# Patient Record
Sex: Male | Born: 1960
Health system: Southern US, Community
[De-identification: ages and names within clinical notes are randomized; demographics above are authoritative.]

## PROBLEM LIST (undated history)

## (undated) DIAGNOSIS — E669 Obesity, unspecified: Secondary | ICD-10-CM

## (undated) DIAGNOSIS — E785 Hyperlipidemia, unspecified: Secondary | ICD-10-CM

## (undated) HISTORY — PX: TONSILLECTOMY: SUR1361

## (undated) HISTORY — DX: Obesity, unspecified: E66.9

## (undated) HISTORY — PX: UMBILICAL HERNIA REPAIR: SHX2598

## (undated) HISTORY — PX: UMBILICAL HERNIA REPAIR: SHX196

## (undated) HISTORY — DX: Hyperlipidemia, unspecified: E78.5

---

## 2015-04-29 ENCOUNTER — Emergency Department (HOSPITAL_COMMUNITY)
Admission: EM | Admit: 2015-04-29 | Discharge: 2015-04-29 | Disposition: A | Discharge status: Home / Self Care | Payer: Self-pay | Attending: Emergency Medicine | Admitting: Emergency Medicine

## 2015-04-29 ENCOUNTER — Encounter (HOSPITAL_COMMUNITY): Payer: Self-pay | Admitting: Emergency Medicine

## 2015-04-29 ENCOUNTER — Emergency Department (HOSPITAL_COMMUNITY): Payer: Self-pay

## 2015-04-29 DIAGNOSIS — Y939 Activity, unspecified: Secondary | ICD-10-CM | POA: Insufficient documentation

## 2015-04-29 DIAGNOSIS — S82431A Displaced oblique fracture of shaft of right fibula, initial encounter for closed fracture: Secondary | ICD-10-CM | POA: Insufficient documentation

## 2015-04-29 DIAGNOSIS — W1840XA Slipping, tripping and stumbling without falling, unspecified, initial encounter: Secondary | ICD-10-CM | POA: Insufficient documentation

## 2015-04-29 DIAGNOSIS — Y999 Unspecified external cause status: Secondary | ICD-10-CM | POA: Insufficient documentation

## 2015-04-29 DIAGNOSIS — Y929 Unspecified place or not applicable: Secondary | ICD-10-CM | POA: Insufficient documentation

## 2015-04-29 DIAGNOSIS — S82409A Unspecified fracture of shaft of unspecified fibula, initial encounter for closed fracture: Secondary | ICD-10-CM

## 2015-04-29 MED ORDER — HYDROCODONE-ACETAMINOPHEN 5-325 MG PO TABS
1.0000 | ORAL_TABLET | Freq: Once | ORAL | Status: AC
  Administered 2015-04-29: 1 via ORAL
  Filled 2015-04-29: qty 1

## 2015-04-29 MED ORDER — HYDROCODONE-ACETAMINOPHEN 5-325 MG PO TABS: 2.0000 | ORAL_TABLET | ORAL | Status: AC | PRN

## 2015-04-29 NOTE — Discharge Instructions (Signed)
Tibial and Fibular Fracture, Adult Tibial and fibular fracture is a break in the bones of your lower leg (tibia and fibula). The tibia is the larger of these two bones. The fibula is the smaller of the two bones. It is on the outer side of your leg.  CAUSES  Low-energy injuries, such as a fall from ground level.  High-energy injuries, such as motor vehicle injuries, gunshot wounds, or high-speed sports collisions. RISK FACTORS  Jumping activities.  Repetitive stress, such as long-distance running.  Participation in sports.  Osteoporosis.  Advanced age. SIGNS AND SYMPTOMS  Pain.  Swelling.  Inability to put weight on your injured leg.  Bone deformities at the site of your injury.  Bruising. DIAGNOSIS  Tibial and fibular fractures are diagnosed with the use of X-ray exams. TREATMENT  If you have a simple fracture of these two bones, they can be treated with simple immobilization. A cast or splint will be used on your leg to keep it from moving while it heals. Then you can begin range-of-motion exercises to regain your knee motion. HOME CARE INSTRUCTIONS   Apply ice to your leg:  Put ice in a plastic bag.  Place a towel between your skin and the bag.  Leave the ice on for 20 minutes, 2-3 times a day.  If you have a plaster or fiberglass cast:  Do not try to scratch the skin under the cast using sharp or pointed objects.  Check the skin around the cast every day. You may put lotion on any red or sore areas.  Keep your cast dry and clean.  If you have a plaster splint:  Wear the splint as directed.  You may loosen the elastic around the splint if your toes become numb, tingle, or turn cold or blue.  Do not put pressure on any part of your cast or splint until it is fully hardened, because it may deform.  Your cast or splint can be protected during bathing with a plastic bag. Do not lower the cast or splint into water.  Use crutches as directed.  Only take  over-the-counter or prescription medicines for pain, discomfort, or fever as directed by your health care provider.  Follow all instructions given to you by your health care provider.  Make and keep all follow-up appointments. SEEK MEDICAL CARE IF:  Your pain is becoming worse rather than better or is not controlled with medicines.  You have increased swelling or redness in the foot.  You begin to lose feeling in your foot or toes. SEEK IMMEDIATE MEDICAL CARE IF:  You develop a cold or blue foot or toes on the injured side.  You develop severe pain in your injured leg, especially if the pain is increased with movement of your toes. MAKE SURE YOU:  Understand these instructions.  Will watch your condition.  Will get help right away if you are not doing well or get worse.   This information is not intended to replace advice given to you by your health care provider. Make sure you discuss any questions you have with your health care provider.   Follow-up with an orthopedic provider as soon as possible for reevaluation. Avoid bearing weight on your right lower extremity. Keep leg elevated as much as possible. Apply ice to affected area. Take pain medication as needed. Return to the ED if you experience severe worsening of her symptoms, calf tenderness or swelling, increased redness or swelling around your ankle, fevers, chills, chest pain or  shortness of breath.

## 2015-04-29 NOTE — ED Notes (Signed)
Pt reports slipping on the stairs and landing wrong on his right ankle.  He has taken motrin and applied ice consistently with no relief.  Positive for swelling.  Pt is able to take some steps.

## 2015-04-29 NOTE — ED Provider Notes (Signed)
[name]: [id number]     Arrival date & time 04/29/15  1738 History  By signing my name below, I, Octavia Heirrianna Nassar, attest that this documentation has been prepared under the direction and in the presence of [employer] InstrumentsSamantha Tripp Reeta Kuk, PA-C. Electronically Signed: Octavia HeirArianna Nassar, ED Scribe. 04/29/2015. 7:41 PM.      Chief Complaint  Patient presents with  . Ankle Injury      The history is provided by the patient. No language interpreter was used.   HPI Comments: Robert Calhoun is a 55 y.o. male who presents to the Emergency Department complaining of a sudden onset, gradual worsening, moderate, right ankle injury onset last night. Pt has associated swelling and a small abrasion to his right foot. Pt reports he was outside during the rain, slipped and twisted his right ankle. He reports icing and elevating his ankle consistently with no relief. Pt is able to ambulate but reports minor pain when applying pressure to the foot. Pt has taken 800 mg of Motrin to alleviate his pain with temporary relief. There was no other injuries.  History reviewed. No pertinent past medical history. Past Surgical History  Procedure Laterality Date  . Umbilical hernia repair     No family history on file. Social History  Substance Use Topics  . Smoking status: Never Smoker   . Smokeless tobacco: None  . Alcohol Use: No    Review of Systems  A complete 10 system review of systems was obtained and all systems are negative except as noted in the HPI and PMH.   Allergies  Review of patient's allergies indicates no known allergies.  Home Medications   Prior to Admission medications   Not on File   Triage vitals: BP 131/76 mmHg  Pulse 93  Temp(Src) 97.9 F (36.6 C) (Oral)  Resp 15  Ht 6' (1.829 m)  SpO2 95% Physical Exam  Constitutional: He is oriented to person, place, and time. He appears well-developed and well-nourished.  HENT:  Head: Normocephalic.  Eyes: EOM are normal.  Neck: Normal range of  motion.  Pulmonary/Chest: Effort normal.  Abdominal: He exhibits no distension.  Musculoskeletal: Normal range of motion.  TTP of R medial malleolus with associated ecchymosis and edema. No decreased ROM of ankle. No obvious bony deformity. Intact distal pulses. Mild TTP of distal tibia with overlying superficial abrasion.  Neurological: He is alert and oriented to person, place, and time.  Psychiatric: He has a normal mood and affect.  Nursing note and vitals reviewed.   ED Course  Procedures  DIAGNOSTIC STUDIES: Oxygen Saturation is 95% on RA, normal by my interpretation.  COORDINATION OF CARE:  7:29 PM Will order right knee x-ray. Discussed treatment plan which includes crutches with pt at bedside and pt agreed to plan. He was advised to follow up with an orthopedic specialist.  Labs Review Labs Reviewed - No data to display  Imaging Review Dg Ankle Complete Right  04/29/2015  CLINICAL DATA:  Right ankle pain and swelling following twisting injury and fall today. EXAM: RIGHT ANKLE - COMPLETE 3+ VIEW COMPARISON:  None. FINDINGS: There is an oblique, mildly comminuted and displaced fracture of the distal fibula extending proximally from the ankle mortise. The ankle mortise is mildly widened and there is mild widening between the medial malleolus and the talus. The distal tibia demonstrates no acute fracture. There are underlying tibiotalar degenerative changes with fragmented spurring. There are small calcaneal spurs. Soft tissue swelling is present in the lower leg and hindfoot. IMPRESSION:  Mildly displaced oblique fracture of the distal fibula with widening of the ankle mortise suggesting superimposed medial ligamentous injury. Electronically Signed   By: Carey Bullocks M.D.   On: 04/29/2015 19:06   I have personally reviewed and evaluated these images and lab results as part of my medical decision-making.   EKG Interpretation None      MDM   Final diagnoses:  Fibula fracture    Xray reveals mildly displaced oblique fx of the distal fibula with widening of the ankle mortise suggesting superimposed medial ligamentous injury. Pt had associated TTP of distal tibia. Tib/fib xray obtained to r/o Masonneuve fx which was negative. Pt placed in U splint and given crutches. Neurovascularly intact. Pain medication and RICE precautions given. Pt will follow up with ortho, referral given. Return precautions outlined in patient discharge instructions.    I personally performed the services described in this documentation, which was scribed in my presence. The recorded information has been reviewed and is accurate.    Lester Kinsman [place], PA-C 04/30/15 1619  Lyndal Pulley, MD 04/30/15 [phone]

## 2015-05-02 ENCOUNTER — Other Ambulatory Visit: Payer: Self-pay | Admitting: Orthopedic Surgery

## 2015-05-03 ENCOUNTER — Ambulatory Visit (HOSPITAL_COMMUNITY)
Admission: RE | Admit: 2015-05-03 | Discharge: 2015-05-04 | Disposition: A | Discharge status: Home / Self Care | Payer: Self-pay | Source: Ambulatory Visit | Attending: Orthopedic Surgery | Admitting: Orthopedic Surgery

## 2015-05-03 ENCOUNTER — Ambulatory Visit (HOSPITAL_COMMUNITY): Payer: Self-pay

## 2015-05-03 ENCOUNTER — Encounter (HOSPITAL_COMMUNITY)
Admission: RE | Disposition: A | Discharge status: Home / Self Care | Payer: Self-pay | Source: Ambulatory Visit | Attending: Orthopedic Surgery

## 2015-05-03 ENCOUNTER — Ambulatory Visit (HOSPITAL_COMMUNITY): Payer: MEDICAID | Admitting: Certified Registered"

## 2015-05-03 ENCOUNTER — Encounter (HOSPITAL_COMMUNITY): Payer: Self-pay | Admitting: *Deleted

## 2015-05-03 DIAGNOSIS — S8261XA Displaced fracture of lateral malleolus of right fibula, initial encounter for closed fracture: Secondary | ICD-10-CM | POA: Insufficient documentation

## 2015-05-03 DIAGNOSIS — S8263XA Displaced fracture of lateral malleolus of unspecified fibula, initial encounter for closed fracture: Secondary | ICD-10-CM | POA: Diagnosis present

## 2015-05-03 DIAGNOSIS — S82899A Other fracture of unspecified lower leg, initial encounter for closed fracture: Secondary | ICD-10-CM

## 2015-05-03 DIAGNOSIS — W19XXXA Unspecified fall, initial encounter: Secondary | ICD-10-CM | POA: Insufficient documentation

## 2015-05-03 DIAGNOSIS — Z6841 Body Mass Index (BMI) 40.0 and over, adult: Secondary | ICD-10-CM | POA: Insufficient documentation

## 2015-05-03 HISTORY — PX: ORIF ANKLE FRACTURE: SHX5408

## 2015-05-03 LAB — APTT: APTT: 31 s (ref 24–37)

## 2015-05-03 LAB — CBC
HEMATOCRIT: 48.2 % (ref 39.0–52.0)
HEMOGLOBIN: 15.8 g/dL (ref 13.0–17.0)
MCH: 29.6 pg (ref 26.0–34.0)
MCHC: 32.8 g/dL (ref 30.0–36.0)
MCV: 90.4 fL (ref 78.0–100.0)
Platelets: 169 10*3/uL (ref 150–400)
RBC: 5.33 MIL/uL (ref 4.22–5.81)
RDW: 13.2 % (ref 11.5–15.5)
WBC: 9.2 10*3/uL (ref 4.0–10.5)

## 2015-05-03 LAB — COMPREHENSIVE METABOLIC PANEL
ALK PHOS: 43 U/L (ref 38–126)
ALT: 20 U/L (ref 17–63)
AST: 23 U/L (ref 15–41)
Albumin: 3.6 g/dL (ref 3.5–5.0)
Anion gap: 9 (ref 5–15)
BILIRUBIN TOTAL: 0.6 mg/dL (ref 0.3–1.2)
BUN: 13 mg/dL (ref 6–20)
CALCIUM: 9.1 mg/dL (ref 8.9–10.3)
CO2: 25 mmol/L (ref 22–32)
Chloride: 104 mmol/L (ref 101–111)
Creatinine, Ser: 0.82 mg/dL (ref 0.61–1.24)
Glucose, Bld: 96 mg/dL (ref 65–99)
Potassium: 4.2 mmol/L (ref 3.5–5.1)
Sodium: 138 mmol/L (ref 135–145)
TOTAL PROTEIN: 7.6 g/dL (ref 6.5–8.1)

## 2015-05-03 LAB — SURGICAL PCR SCREEN
MRSA, PCR: NEGATIVE
Staphylococcus aureus: NEGATIVE

## 2015-05-03 LAB — PROTIME-INR
INR: 1.13 (ref 0.00–1.49)
Prothrombin Time: 14.7 seconds (ref 11.6–15.2)

## 2015-05-03 SURGERY — OPEN REDUCTION INTERNAL FIXATION (ORIF) ANKLE FRACTURE
Anesthesia: General | Site: Ankle | Laterality: Right

## 2015-05-03 MED ORDER — CEFAZOLIN SODIUM-DEXTROSE 2-4 GM/100ML-% IV SOLN
2.0000 g | Freq: Four times a day (QID) | INTRAVENOUS | Status: DC
  Filled 2015-05-03 (×2): qty 100

## 2015-05-03 MED ORDER — OXYCODONE HCL 5 MG PO TABS: 5.0000 mg | ORAL_TABLET | Freq: Once | ORAL | Status: DC | PRN

## 2015-05-03 MED ORDER — OXYCODONE HCL 5 MG/5ML PO SOLN: 5.0000 mg | Freq: Once | ORAL | Status: DC | PRN

## 2015-05-03 MED ORDER — FENTANYL CITRATE (PF) 250 MCG/5ML IJ SOLN
INTRAMUSCULAR | Status: AC
Start: 2015-05-03 — End: 2015-05-03
  Filled 2015-05-03: qty 5

## 2015-05-03 MED ORDER — POLYETHYLENE GLYCOL 3350 17 G PO PACK: 17.0000 g | PACK | Freq: Every day | ORAL | Status: DC | PRN

## 2015-05-03 MED ORDER — LACTATED RINGERS IV SOLN
INTRAVENOUS | Status: DC
  Administered 2015-05-03 (×2): via INTRAVENOUS

## 2015-05-03 MED ORDER — METOCLOPRAMIDE HCL 5 MG/ML IJ SOLN: 5.0000 mg | Freq: Three times a day (TID) | INTRAMUSCULAR | Status: DC | PRN

## 2015-05-03 MED ORDER — ACETAMINOPHEN 160 MG/5ML PO SOLN: 325.0000 mg | ORAL | Status: DC | PRN

## 2015-05-03 MED ORDER — SODIUM CHLORIDE 0.9 % IV SOLN: INTRAVENOUS | Status: DC

## 2015-05-03 MED ORDER — PROPOFOL 10 MG/ML IV BOLUS
INTRAVENOUS | Status: DC | PRN
  Administered 2015-05-03: 200 mg via INTRAVENOUS

## 2015-05-03 MED ORDER — ONDANSETRON HCL 4 MG/2ML IJ SOLN
INTRAMUSCULAR | Status: DC | PRN
  Administered 2015-05-03: 4 mg via INTRAVENOUS

## 2015-05-03 MED ORDER — CEFAZOLIN SODIUM-DEXTROSE 2-4 GM/100ML-% IV SOLN
2.0000 g | Freq: Four times a day (QID) | INTRAVENOUS | Status: DC
Start: 2015-05-04 — End: 2015-05-04
  Administered 2015-05-04 (×2): 2 g via INTRAVENOUS
  Filled 2015-05-03 (×3): qty 100

## 2015-05-03 MED ORDER — ASPIRIN EC 325 MG PO TBEC
325.0000 mg | DELAYED_RELEASE_TABLET | Freq: Every day | ORAL | Status: DC
  Administered 2015-05-03 – 2015-05-04 (×2): 325 mg via ORAL
  Filled 2015-05-03 (×2): qty 1

## 2015-05-03 MED ORDER — PROPOFOL 10 MG/ML IV BOLUS
INTRAVENOUS | Status: AC
  Filled 2015-05-03: qty 20

## 2015-05-03 MED ORDER — MAGNESIUM CITRATE PO SOLN
1.0000 | Freq: Once | ORAL | Status: AC | PRN
  Administered 2015-05-04: 1 via ORAL
  Filled 2015-05-03: qty 296

## 2015-05-03 MED ORDER — FENTANYL CITRATE (PF) 100 MCG/2ML IJ SOLN
INTRAMUSCULAR | Status: AC
  Administered 2015-05-03: 50 ug
  Filled 2015-05-03: qty 2

## 2015-05-03 MED ORDER — METOCLOPRAMIDE HCL 5 MG PO TABS
5.0000 mg | ORAL_TABLET | Freq: Three times a day (TID) | ORAL | Status: DC | PRN
Start: 2015-05-03 — End: 2015-05-04

## 2015-05-03 MED ORDER — MIDAZOLAM HCL 2 MG/2ML IJ SOLN
INTRAMUSCULAR | Status: AC
  Administered 2015-05-03: 2 mg
  Filled 2015-05-03: qty 2

## 2015-05-03 MED ORDER — PHENYLEPHRINE HCL 10 MG/ML IJ SOLN
INTRAMUSCULAR | Status: DC | PRN
  Administered 2015-05-03: 120 ug via INTRAVENOUS
  Administered 2015-05-03 (×2): 80 ug via INTRAVENOUS

## 2015-05-03 MED ORDER — OXYCODONE-ACETAMINOPHEN 5-325 MG PO TABS: 1.0000 | ORAL_TABLET | ORAL | Status: AC | PRN

## 2015-05-03 MED ORDER — METHOCARBAMOL 500 MG PO TABS
500.0000 mg | ORAL_TABLET | Freq: Four times a day (QID) | ORAL | Status: DC | PRN
  Administered 2015-05-04: 500 mg via ORAL
  Filled 2015-05-03: qty 1

## 2015-05-03 MED ORDER — SUCCINYLCHOLINE CHLORIDE 20 MG/ML IJ SOLN
INTRAMUSCULAR | Status: DC | PRN
  Administered 2015-05-03: 80 mg via INTRAVENOUS

## 2015-05-03 MED ORDER — ACETAMINOPHEN 650 MG RE SUPP
650.0000 mg | Freq: Four times a day (QID) | RECTAL | Status: DC | PRN
Start: 2015-05-03 — End: 2015-05-04

## 2015-05-03 MED ORDER — ACETAMINOPHEN 325 MG PO TABS: 650.0000 mg | ORAL_TABLET | Freq: Four times a day (QID) | ORAL | Status: DC | PRN

## 2015-05-03 MED ORDER — DEXAMETHASONE SODIUM PHOSPHATE 10 MG/ML IJ SOLN
INTRAMUSCULAR | Status: DC | PRN
  Administered 2015-05-03: 10 mg via INTRAVENOUS

## 2015-05-03 MED ORDER — FENTANYL CITRATE (PF) 100 MCG/2ML IJ SOLN
INTRAMUSCULAR | Status: DC | PRN
  Administered 2015-05-03 (×2): 50 ug via INTRAVENOUS
  Administered 2015-05-03: 150 ug via INTRAVENOUS

## 2015-05-03 MED ORDER — HYDROMORPHONE HCL 1 MG/ML IJ SOLN: 0.2500 mg | INTRAMUSCULAR | Status: DC | PRN

## 2015-05-03 MED ORDER — METHOCARBAMOL 1000 MG/10ML IJ SOLN
500.0000 mg | Freq: Four times a day (QID) | INTRAVENOUS | Status: DC | PRN
  Filled 2015-05-03: qty 5

## 2015-05-03 MED ORDER — ONDANSETRON HCL 4 MG/2ML IJ SOLN: 4.0000 mg | Freq: Four times a day (QID) | INTRAMUSCULAR | Status: DC | PRN

## 2015-05-03 MED ORDER — BISACODYL 10 MG RE SUPP: 10.0000 mg | Freq: Every day | RECTAL | Status: DC | PRN

## 2015-05-03 MED ORDER — DEXTROSE 5 % IV SOLN
3.0000 g | INTRAVENOUS | Status: AC
  Administered 2015-05-03: 3 g via INTRAVENOUS
  Filled 2015-05-03: qty 3000

## 2015-05-03 MED ORDER — ONDANSETRON HCL 4 MG PO TABS: 4.0000 mg | ORAL_TABLET | Freq: Four times a day (QID) | ORAL | Status: DC | PRN

## 2015-05-03 MED ORDER — BUPIVACAINE-EPINEPHRINE (PF) 0.5% -1:200000 IJ SOLN
INTRAMUSCULAR | Status: DC | PRN
  Administered 2015-05-03: 30 mL via PERINEURAL
  Administered 2015-05-03: 10 mL via PERINEURAL

## 2015-05-03 MED ORDER — ACETAMINOPHEN 325 MG PO TABS: 325.0000 mg | ORAL_TABLET | ORAL | Status: DC | PRN

## 2015-05-03 MED ORDER — MUPIROCIN 2 % EX OINT
TOPICAL_OINTMENT | CUTANEOUS | Status: AC
  Administered 2015-05-03: 17:00:00
  Filled 2015-05-03: qty 22

## 2015-05-03 MED ORDER — CHLORHEXIDINE GLUCONATE 4 % EX LIQD: 60.0000 mL | Freq: Once | CUTANEOUS | Status: DC

## 2015-05-03 MED ORDER — 0.9 % SODIUM CHLORIDE (POUR BTL) OPTIME
TOPICAL | Status: DC | PRN
  Administered 2015-05-03: 1000 mL

## 2015-05-03 MED ORDER — OXYCODONE HCL 5 MG PO TABS
5.0000 mg | ORAL_TABLET | ORAL | Status: DC | PRN
  Administered 2015-05-04: 10 mg via ORAL
  Filled 2015-05-03: qty 2

## 2015-05-03 MED ORDER — FENTANYL CITRATE (PF) 250 MCG/5ML IJ SOLN
INTRAMUSCULAR | Status: AC
  Filled 2015-05-03: qty 5

## 2015-05-03 MED ORDER — LIDOCAINE 2% (20 MG/ML) 5 ML SYRINGE
INTRAMUSCULAR | Status: AC
  Filled 2015-05-03: qty 5

## 2015-05-03 MED ORDER — MIDAZOLAM HCL 2 MG/2ML IJ SOLN
INTRAMUSCULAR | Status: AC
  Filled 2015-05-03: qty 2

## 2015-05-03 MED ORDER — DOCUSATE SODIUM 100 MG PO CAPS
100.0000 mg | ORAL_CAPSULE | Freq: Two times a day (BID) | ORAL | Status: DC
  Administered 2015-05-03 – 2015-05-04 (×2): 100 mg via ORAL
  Filled 2015-05-03 (×2): qty 1

## 2015-05-03 MED ORDER — HYDROMORPHONE HCL 1 MG/ML IJ SOLN: 1.0000 mg | INTRAMUSCULAR | Status: DC | PRN

## 2015-05-03 MED ORDER — LIDOCAINE 2% (20 MG/ML) 5 ML SYRINGE
INTRAMUSCULAR | Status: AC
  Filled 2015-05-03: qty 10

## 2015-05-03 MED ORDER — LIDOCAINE HCL (CARDIAC) 20 MG/ML IV SOLN
INTRAVENOUS | Status: DC | PRN
  Administered 2015-05-03: 60 mg via INTRAVENOUS

## 2015-05-03 SURGICAL SUPPLY — 50 items
BANDAGE ESMARK 6X9 LF (GAUZE/BANDAGES/DRESSINGS) IMPLANT
BIT DRILL 2.5X110 QC LCP DISP (BIT) ×2 IMPLANT
BNDG COHESIVE 4X5 TAN STRL (GAUZE/BANDAGES/DRESSINGS) ×2 IMPLANT
BNDG ESMARK 6X9 LF (GAUZE/BANDAGES/DRESSINGS)
BNDG GAUZE ELAST 4 BULKY (GAUZE/BANDAGES/DRESSINGS) ×2 IMPLANT
COVER SURGICAL LIGHT HANDLE (MISCELLANEOUS) ×4 IMPLANT
CUFF TOURNIQUET SINGLE 34IN LL (TOURNIQUET CUFF) IMPLANT
CUFF TOURNIQUET SINGLE 44IN (TOURNIQUET CUFF) IMPLANT
DRAPE INCISE IOBAN 66X45 STRL (DRAPES) IMPLANT
DRAPE OEC MINIVIEW 54X84 (DRAPES) ×2 IMPLANT
DRAPE PROXIMA HALF (DRAPES) ×2 IMPLANT
DRAPE U-SHAPE 47X51 STRL (DRAPES) ×2 IMPLANT
DRSG ADAPTIC 3X8 NADH LF (GAUZE/BANDAGES/DRESSINGS) ×2 IMPLANT
DRSG PAD ABDOMINAL 8X10 ST (GAUZE/BANDAGES/DRESSINGS) IMPLANT
DURAPREP 26ML APPLICATOR (WOUND CARE) ×2 IMPLANT
ELECT REM PT RETURN 9FT ADLT (ELECTROSURGICAL) ×2
ELECTRODE REM PT RTRN 9FT ADLT (ELECTROSURGICAL) ×1 IMPLANT
GAUZE SPONGE 4X4 12PLY STRL (GAUZE/BANDAGES/DRESSINGS) ×2 IMPLANT
GLOVE BIOGEL PI IND STRL 9 (GLOVE) ×1 IMPLANT
GLOVE BIOGEL PI INDICATOR 9 (GLOVE) ×1
GLOVE SURG ORTHO 9.0 STRL STRW (GLOVE) ×2 IMPLANT
GOWN STRL REUS W/ TWL XL LVL3 (GOWN DISPOSABLE) ×3 IMPLANT
GOWN STRL REUS W/TWL XL LVL3 (GOWN DISPOSABLE) ×3
KIT 1/3 TUB PL 8H 97M (Orthopedic Implant) ×1 IMPLANT
KIT BASIN OR (CUSTOM PROCEDURE TRAY) ×2 IMPLANT
KIT PREVENA INCISION MGT 13 (CANNISTER) ×2 IMPLANT
KIT ROOM TURNOVER OR (KITS) ×2 IMPLANT
MANIFOLD NEPTUNE II (INSTRUMENTS) ×2 IMPLANT
NS IRRIG 1000ML POUR BTL (IV SOLUTION) ×2 IMPLANT
PACK ORTHO EXTREMITY (CUSTOM PROCEDURE TRAY) ×2 IMPLANT
PAD ARMBOARD 7.5X6 YLW CONV (MISCELLANEOUS) ×4 IMPLANT
PROS 1/3 TUB PL 8H 97M (Orthopedic Implant) ×2 IMPLANT
SCREW CORTEX 3.5 12MM (Screw) ×2 IMPLANT
SCREW CORTEX 3.5 14MM (Screw) ×1 IMPLANT
SCREW CORTEX 3.5 20MM (Screw) ×1 IMPLANT
SCREW LOCK CORT ST 3.5X12 (Screw) ×2 IMPLANT
SCREW LOCK CORT ST 3.5X14 (Screw) ×1 IMPLANT
SCREW LOCK CORT ST 3.5X20 (Screw) ×1 IMPLANT
SCREW LOCK T15 FT 14X3.5X2.9X (Screw) ×2 IMPLANT
SCREW LOCKING 3.5X14 (Screw) ×2 IMPLANT
SPONGE LAP 18X18 X RAY DECT (DISPOSABLE) IMPLANT
STAPLER VISISTAT 35W (STAPLE) IMPLANT
SUCTION FRAZIER HANDLE 10FR (MISCELLANEOUS) ×1
SUCTION TUBE FRAZIER 10FR DISP (MISCELLANEOUS) ×1 IMPLANT
SUT ETHILON 2 0 PSLX (SUTURE) ×4 IMPLANT
SUT VIC AB 2-0 CT1 27 (SUTURE) ×2
SUT VIC AB 2-0 CT1 TAPERPNT 27 (SUTURE) ×2 IMPLANT
TOWEL OR 17X24 6PK STRL BLUE (TOWEL DISPOSABLE) ×2 IMPLANT
TOWEL OR 17X26 10 PK STRL BLUE (TOWEL DISPOSABLE) ×2 IMPLANT
TUBE CONNECTING 12X1/4 (SUCTIONS) ×2 IMPLANT

## 2015-05-03 NOTE — Anesthesia Procedure Notes (Addendum)
Anesthesia Regional Block:  Popliteal block  Pre-Anesthetic Checklist: ,, timeout performed, Correct Patient, Correct Site, Correct Laterality, Correct Procedure, Correct Position, site marked, Risks and benefits discussed,  Surgical consent,  Pre-op evaluation,  At surgeon's request and post-op pain management  Laterality: Lower and Right  Prep: chloraprep       Needles:  Injection technique: Single-shot  Needle Type: Echogenic Stimulator Needle          Additional Needles:  Procedures: ultrasound guided (picture in chart) and nerve stimulator Popliteal block  Nerve Stimulator or Paresthesia:  Response: plantar, 0.5 mA,   Additional Responses:   Narrative:  Injection made incrementally with aspirations every 5 mL.  Performed by: Personally  Anesthesiologist: MOSER, CHRISTOPHER  Additional Notes: H+P and labs reviewed, risks and benefits discussed with patient, procedure tolerated well without complications. Saphenous field block done after prep with 10ml 0.5% Marcaine injected with sequential negative aspirations   Procedure Name: Intubation Date/Time: 05/03/2015 8:04 PM Performed by: Rosiland OzMEYERS, Kamarius Buckbee Pre-anesthesia Checklist: Patient identified, Emergency Drugs available, Suction available, Patient being monitored and Timeout performed Patient Re-evaluated:Patient Re-evaluated prior to inductionOxygen Delivery Method: Circle system utilized Preoxygenation: Pre-oxygenation with 100% oxygen Intubation Type: IV induction Ventilation: Oral airway inserted - appropriate to patient size and Mask ventilation without difficulty Laryngoscope Size: Glidescope and 4 Grade View: Grade I Tube type: Oral Tube size: 7.5 mm Number of attempts: 1 Airway Equipment and Method: Stylet and Video-laryngoscopy Placement Confirmation: ETT inserted through vocal cords under direct vision,  positive ETCO2 and breath sounds checked- equal and bilateral Secured at: 22 cm Tube secured with:  Tape Dental Injury: Teeth and Oropharynx as per pre-operative assessment

## 2015-05-03 NOTE — Op Note (Signed)
05/03/2015  8:45 PM  PATIENT:  Robert Calhoun    PRE-OPERATIVE DIAGNOSIS:  Right Ankle Fracture  POST-OPERATIVE DIAGNOSIS:  Same  PROCEDURE:  OPEN REDUCTION INTERNAL FIXATION (ORIF) RIGHT ANKLE lateral malleolus Placement of Prevena wound VAC SURGEON:  Nadara MustardUDA,MARCUS V, MD  PHYSICIAN ASSISTANT:None ANESTHESIA:   General  PREOPERATIVE INDICATIONS:  Robert Calhoun is a  55 y.o. male with a diagnosis of Right Ankle Fracture who failed conservative measures and elected for surgical management.    The risks benefits and alternatives were discussed with the patient preoperatively including but not limited to the risks of infection, bleeding, nerve injury, cardiopulmonary complications, the need for revision surgery, among others, and the patient was willing to proceed.  OPERATIVE IMPLANTS: 8 hold one third tubular locking plate  OPERATIVE FINDINGS: comminuted fibula and 3 separate fragments  OPERATIVE PROCEDURE: patient brought the operating room and underwent general anesthetic after a popliteal block. After adequate levels anesthesia obtained patient's right lower extremity was prepped using DuraPrep draped into a sterile field. A timeout was called. A lateral incision was made over the fibula this was carried sharply down to the fracture site. The fracture site was freshened irrigated thefirst 2 segments were lag screwed to stabilize the first 2 segments and then this was used to stabilize the most distal segment. A one third tubular plate was placed posterior laterally and this was secured proximally with 3 cortical compression screws secured distally with 2 locking screws. Wounds irrigated with normal saline C-arm floss be verified reduction. The skin was closed using 2-0 nylon. A Prevena wound VAC was applied this had a good suction fit patient was extubated taken the PACU in stable condition.  Plan for overnight observation.  Discharge to home in the morning prescription for Percocet on  the chart.. Nonweightbearing right lower extremity.

## 2015-05-03 NOTE — H&P (Signed)
Robert GaviaGregory Calhoun is an 55 y.o. male.   Chief Complaint: Weber B right ankle fracture HPI: Patient is a 55 year old gentleman morbidly obese who fell sustaining a supination external rotation injury to his right ankle. Patient sustained a Weber B fracture.  No past medical history on file.  Past Surgical History  Procedure Laterality Date  . Umbilical hernia repair      No family history on file. Social History:  reports that he has never smoked. He does not have any smokeless tobacco history on file. He reports that he does not drink alcohol or use illicit drugs.  Allergies: No Known Allergies  No prescriptions prior to admission    No results found for this or any previous visit (from the past 48 hour(s)). No results found.  Review of Systems  All other systems reviewed and are negative.   There were no vitals taken for this visit. Physical Exam  On examination patient has significant swelling to the right lower extremity but there are no actual blisters no ulcers no skin breakdown. Patient does not have a palpable dorsalis pedis pulse he does have a strong dopplerable biphasic pulse. Radiographs shows a displaced ankle mortise with a Weber B fibular fracture. Assessment/Plan Assessment: Displaced Weber B fibular fracture right ankle.  Plan: We'll plan for open reduction internal fixation. Risks and benefits were discussed including risk of infection neurovascular injury pain arthritis DVT need for additional surgery. Patient states he understands wishes to proceed at this time.  Nadara MustardUDA,MARCUS V, MD 05/03/2015, 6:39 AM

## 2015-05-03 NOTE — Anesthesia Postprocedure Evaluation (Signed)
Anesthesia Post Note  Patient: Bettina GaviaGregory Lacroix  Procedure(s) Performed: Procedure(s) (LRB): OPEN REDUCTION INTERNAL FIXATION (ORIF) RIGHT ANKLE (Right)  Patient location during evaluation: PACU Anesthesia Type: General and Regional Level of consciousness: awake Pain management: pain level controlled Vital Signs Assessment: post-procedure vital signs reviewed and stable Respiratory status: spontaneous breathing Cardiovascular status: stable Postop Assessment: no signs of nausea or vomiting Anesthetic complications: no    Last Vitals:  Filed Vitals:   05/03/15 2106 05/03/15 2143  BP: 122/71 107/66  Pulse: 81 77  Temp:  36.7 C  Resp: 13 16    Last Pain:  Filed Vitals:   05/03/15 2144  PainSc: 1                  Gaynelle Pastrana

## 2015-05-03 NOTE — Progress Notes (Signed)
Apolinar JunesBrandon, CRNA at bedside.

## 2015-05-03 NOTE — Anesthesia Preprocedure Evaluation (Signed)
Anesthesia Evaluation  Patient identified by MRN, date of birth, ID band Patient awake    Reviewed: Allergy & Precautions, NPO status , Patient's Chart, lab work & pertinent test results  History of Anesthesia Complications Negative for: history of anesthetic complications  Airway Mallampati: IV  TM Distance: >3 FB Neck ROM: Full    Dental  (+) Loose,    Pulmonary neg pulmonary ROS,    breath sounds clear to auscultation       Cardiovascular  Rhythm:Regular     Neuro/Psych negative neurological ROS  negative psych ROS   GI/Hepatic negative GI ROS,   Endo/Other  Morbid obesity  Renal/GU negative Renal ROS     Musculoskeletal Right ankle fracture    Abdominal   Peds  Hematology negative hematology ROS (+)   Anesthesia Other Findings   Reproductive/Obstetrics                             Anesthesia Physical Anesthesia Plan  ASA: III  Anesthesia Plan: General   Post-op Pain Management:  Regional for Post-op pain   Induction: Intravenous  Airway Management Planned: Oral ETT  Additional Equipment: None  Intra-op Plan:   Post-operative Plan: Extubation in OR  Informed Consent: I have reviewed the patients History and Physical, chart, labs and discussed the procedure including the risks, benefits and alternatives for the proposed anesthesia with the patient or authorized representative who has indicated his/her understanding and acceptance.   Dental advisory given  Plan Discussed with: CRNA and Surgeon  Anesthesia Plan Comments:         Anesthesia Quick Evaluation

## 2015-05-03 NOTE — Transfer of Care (Signed)
Immediate Anesthesia Transfer of Care Note  Patient: Robert GaviaGregory Nestor  Procedure(s) Performed: Procedure(s): OPEN REDUCTION INTERNAL FIXATION (ORIF) RIGHT ANKLE (Right)  Patient Location: PACU  Anesthesia Type:General  Level of Consciousness: awake, alert , oriented and patient cooperative  Airway & Oxygen Therapy: Patient Spontanous Breathing and Patient connected to face mask oxygen  Post-op Assessment: Report given to RN, Post -op Vital signs reviewed and stable and Patient moving all extremities X 4  Post vital signs: Reviewed and stable  Last Vitals:  Filed Vitals:   05/03/15 1925 05/03/15 1930  BP: 108/64 128/66  Pulse: 80 80  Temp:    Resp: 16 17    Last Pain:  Filed Vitals:   05/03/15 2053  PainSc: 1       Patients Stated Pain Goal: 2 (05/03/15 1704)  Complications: No apparent anesthesia complications

## 2015-05-04 MED ORDER — MAGNESIUM CITRATE PO SOLN: 1.0000 | Freq: Once | ORAL | Status: DC

## 2015-05-04 NOTE — Progress Notes (Signed)
Orthopedic Tech Progress Note Patient Details:  Robert GaviaGregory Calhoun 12/21/1960 [id number]  Ortho Devices Type of Ortho Device: CAM walker Ortho Device/Splint Interventions: Application   [name] 05/04/2015, 5:23 AM

## 2015-05-04 NOTE — Care Management Note (Addendum)
Case Management Note  Patient Details  Name: Narvel Bonneau MRN: [id number] Date of Birth: 10/13/1960  Subjective/Objective: 55 yo M underwent ORIF R ankle              Action/Plan: PT is recommending a bariatric 3-in-1 BSC   Expected Discharge Date:     05/04/15             Expected Discharge Plan:  Home/Self Care  In-House Referral:     Discharge planning Services  CM Consult  Post Acute Care Choice:  Durable Medical Equipment Choice offered to:     DME Arranged:  3-N-1 DME Agency:     HH Arranged:    HH Agency:  [facility]  Status of Service:  Completed, signed off  Medicare Important Message Given:    Date Medicare IM Given:    Medicare IM give by:    Date Additional Medicare IM Given:    Additional Medicare Important Message give by:     If discussed at Long Length of Stay Meetings, dates discussed:    Additional Comments: met with pt and wife at bedside. He plans to return home with the support of his wife. He needs a bariatric 3-in-1 BSC. Contacted Jeff at [facility] for DME referral.  Oliveras-Aizpurua, Jeannette, RN 05/04/2015, 3:02 PM  

## 2015-05-04 NOTE — Progress Notes (Signed)
prevena vac in place, foot wwp, stable for dc home today, all questions answered

## 2015-05-04 NOTE — Progress Notes (Signed)
Physical Therapy Evaluation  Clinical Statement:  Patient is functioning at supervision to Mod I level with mobility and gait.  Able to maintain NWB on RLE.  Recommend 3-in-1 BSC for home use.  Patient ready for d/c from PT perspective.   05/04/15 1339  PT Visit Information  Last PT Received On 05/04/15  Assistance Needed +1  History of Present Illness Patient is a 55 yo male admitted 05/03/15 with Rt ankle fx from fall 1 week pta.  Patient now s/p ORIF Rt ankle fx and VAC placement.   PMH:  morbid obesity  Precautions  Precautions Fall  Restrictions  Weight Bearing Restrictions Yes  RLE Weight Bearing NWB  Home Living  Family/patient expects to be discharged to: Private residence  Living Arrangements Spouse/significant other  Available Help at Discharge Family;Available 24 hours/day  Type of Home Apartment (Basement apartment)  Home Access Stairs to enter  Entrance Stairs-Number of Steps several single steps  Home Layout One level  Engineer, waterBathroom Toilet Standard  Home Equipment Walker - 2 wheels  Prior Function  Level of Independence Independent  Comments Works in sedentary job  Communication  Communication No difficulties  Pain Assessment  Pain Assessment No/denies pain  Cognition  Arousal/Alertness Awake/alert  Behavior During Therapy WFL for tasks assessed/performed;Impulsive  Overall Cognitive Status Within Functional Limits for tasks assessed (Decreased safety awareness - ? baseline)  Upper Extremity Assessment  Upper Extremity Assessment Overall WFL for tasks assessed  Lower Extremity Assessment  Lower Extremity Assessment RLE deficits/detail  RLE Deficits / Details decreased strength/ROM from pain/surgery  Bed Mobility  Overal bed mobility Modified Independent  General bed mobility comments Increased time  Transfers  Overall transfer level Modified independent  Equipment used Rolling walker (2 wheeled)  General transfer comment Verbal cues for safe hand placement to  stand - patient pulling on RW.  Able to move to standing with increased time, maintaining NWB on RLE.  On returning to sitting, patient began sitting before completely in safe position and "flopped" onto bed.  Instructed patient on safe technique - correct position, hand placement on bed, and controlling descent.  Ambulation/Gait  Ambulation/Gait assistance Supervision  Ambulation Distance (Feet) 40 Feet  Assistive device Rolling walker (2 wheeled)  Gait Pattern/deviations Step-to pattern (Hop-to on LLE)  General Gait Details Verbal cues for NWB on RLE, and to move at safe pace.  Patient initially rushing during gait.  Much safer at slower pace.  PT - End of Session  Equipment Utilized During Treatment Gait belt  Activity Tolerance Patient tolerated treatment well  Patient left in bed;with call bell/phone within reach;with family/visitor present (sitting EOB)  Nurse Communication Mobility status (Needs BSC for d/c)  PT Assessment  PT Therapy Diagnosis  Abnormality of gait  PT Recommendation/Assessment Patent does not need any further PT services (Patient to d/c today)  No Skilled PT Patient is supervision for all activity/mobility;Patient will have necessary level of assist by caregiver at discharge  PT Recommendation  Follow Up Recommendations No PT follow up;Supervision for mobility/OOB  PT equipment 3in1 (PT)  Acute Rehab PT Goals  PT Goal Formulation All assessment and education complete, DC therapy  PT Time Calculation  PT Start Time (ACUTE ONLY) 1311  PT Stop Time (ACUTE ONLY) 1337  PT Time Calculation (min) (ACUTE ONLY) 26 min  PT G-Codes **NOT FOR INPATIENT CLASS**  Functional Assessment Tool Used Clinical judgement  Functional Limitation Mobility: Walking and moving around  Mobility: Walking and Moving Around Current Status ([id number]) CI  Mobility:  Walking and Moving Around Goal Status [phone]) CI  Mobility: Walking and Moving Around Discharge Status [phone]) CI  PT General  Charges  $$ ACUTE PT VISIT 1 Procedure  PT Evaluation  $PT Eval Low Complexity 1 Procedure  PT Treatments  $Gait Training 8-22 mins  Durenda Hurt. Renaldo Fiddler, [facility] Acute Rehab Services Pager [phone]

## 2015-05-04 NOTE — Progress Notes (Signed)
Physical Therapy Note Evaluation completed with full note to follow.  Patient needs a 3-in-1 BSC for home use.  Contacted CM and informed RN. Durenda HurtSusan H. Renaldo Fiddleravis, PT, [facility] Acute Rehab Services Pager [phone]

## 2015-05-04 NOTE — Discharge Summary (Signed)
Physician Discharge Summary      Patient ID: Robert Calhoun MRN: [id number] DOB/AGE: Jun 05, 1960 55 y.o.  Admit date: 05/03/2015 Discharge date: 05/04/2015  Admission Diagnoses:  <principal problem not specified>  Discharge Diagnoses:  Active Problems:   Ankle fracture, lateral malleolus, closed   History reviewed. No pertinent past medical history.  Surgeries: Procedure(s): OPEN REDUCTION INTERNAL FIXATION (ORIF) RIGHT ANKLE on 05/03/2015   Consultants (if any):    Discharged Condition: Improved  Hospital Course: Robert Calhoun is an 55 y.o. male who was admitted 05/03/2015 with a diagnosis of <principal problem not specified> and went to the operating room on 05/03/2015 and underwent the above named procedures.    He was given perioperative antibiotics:  Anti-infectives    Start     Dose/Rate Route Frequency Ordered Stop   05/04/15 0600  ceFAZolin (ANCEF) 3 g in dextrose 5 % 50 mL IVPB     3 g 130 mL/hr over 30 Minutes Intravenous On call to O.R. 05/03/15 1536 05/03/15 2006   05/04/15 0200  ceFAZolin (ANCEF) IVPB 2g/100 mL premix     2 g 200 mL/hr over 30 Minutes Intravenous Every 6 hours 05/03/15 2148 05/04/15 1959   05/03/15 2145  ceFAZolin (ANCEF) IVPB 2g/100 mL premix  Status:  Discontinued     2 g 200 mL/hr over 30 Minutes Intravenous Every 6 hours 05/03/15 2139 05/03/15 2148    .  He was given sequential compression devices, early ambulation, and aspirin for DVT prophylaxis.  He benefited maximally from the hospital stay and there were no complications.    Recent vital signs:  Filed Vitals:   05/04/15 0036 05/04/15 0446  BP: 118/67 139/73  Pulse: 75 86  Temp: 98.3 F (36.8 C) 98.2 F (36.8 C)  Resp: 16 16    Recent laboratory studies:  Lab Results  Component Value Date   HGB 15.8 05/03/2015   Lab Results  Component Value Date   WBC 9.2 05/03/2015   PLT 169 05/03/2015   Lab Results  Component Value Date   INR 1.13 05/03/2015   Lab Results    Component Value Date   NA 138 05/03/2015   K 4.2 05/03/2015   CL 104 05/03/2015   CO2 25 05/03/2015   BUN 13 05/03/2015   CREATININE 0.82 05/03/2015   GLUCOSE 96 05/03/2015    Discharge Medications:     Medication List    TAKE these medications        HYDROcodone-acetaminophen 5-325 MG tablet  Commonly known as:  NORCO/VICODIN  Take 2 tablets by mouth every 4 (four) hours as needed.     oxyCODONE-acetaminophen 5-325 MG tablet  Commonly known as:  ROXICET  Take 1 tablet by mouth every 4 (four) hours as needed for severe pain.        Diagnostic Studies: Dg Chest 2 View  05/03/2015  CLINICAL DATA:  Preoperative examination prior to right ankle ORIF. EXAM: CHEST  2 VIEW COMPARISON:  None. FINDINGS: Examination is degraded due to patient body habitus. Enlarged cardiac silhouette and mediastinal contours. Minimal bilateral infrahilar heterogeneous opacities favored to represent atelectasis. No focal airspace opacities. No pleural effusion or pneumothorax. No evidence of edema. No acute osseous abnormalities. IMPRESSION: Cardiomegaly and infrahilar atelectasis without acute cardiopulmonary disease. Electronically Signed   By: Simonne ComeJohn  Watts M.D.   On: 05/03/2015 16:16   Dg Tibia/fibula Right  04/29/2015  CLINICAL DATA:  Sister fibular fracture with mortise widening. Evaluate for Masonneuve injury. EXAM: RIGHT TIBIA AND FIBULA - 2  VIEW COMPARISON:  None. FINDINGS: Re- demonstrated obliquely oriented minimally displaced fracture involving the distal fibular metadiaphysis with extension to involve the distal tib-fib joint. This finding is without associated fracture of the more proximal tibia or fibula. Expected adjacent soft tissue swelling.  No radiopaque foreign body. Small plantar calcaneal spur. Minimal enthesopathic change of the Achilles tendon insertion site Moderate tricompartmental degenerative change of the knee, likely worse with the lateral compartment with joint space loss,  subchondral sclerosis and osteophytosis. There is minimal spurring the tibial spines. No evidence of chondrocalcinosis. No joint effusion. IMPRESSION: 1. Re- demonstrated distal fibular metadiaphyseal fracture with extension to the distal tib-fib joint. 2. No additional fracture involving the proximal tibia or fibula. Electronically Signed   By: Simonne Come M.D.   On: 04/29/2015 20:00   Dg Ankle Complete Right  04/29/2015  CLINICAL DATA:  Right ankle pain and swelling following twisting injury and fall today. EXAM: RIGHT ANKLE - COMPLETE 3+ VIEW COMPARISON:  None. FINDINGS: There is an oblique, mildly comminuted and displaced fracture of the distal fibula extending proximally from the ankle mortise. The ankle mortise is mildly widened and there is mild widening between the medial malleolus and the talus. The distal tibia demonstrates no acute fracture. There are underlying tibiotalar degenerative changes with fragmented spurring. There are small calcaneal spurs. Soft tissue swelling is present in the lower leg and hindfoot. IMPRESSION: Mildly displaced oblique fracture of the distal fibula with widening of the ankle mortise suggesting superimposed medial ligamentous injury. Electronically Signed   By: Carey Bullocks M.D.   On: 04/29/2015 19:06    Disposition: 01-Home or Self Care      Discharge Instructions    Call MD / Call 911    Complete by:  As directed   If you experience chest pain or shortness of breath, CALL 911 and be transported to the hospital emergency room.  If you develope a fever above 101 F, pus (white drainage) or increased drainage or redness at the wound, or calf pain, call your surgeon's office.     Call MD / Call 911    Complete by:  As directed   If you experience chest pain or shortness of breath, CALL 911 and be transported to the hospital emergency room.  If you develope a fever above 101.5 F, pus (white drainage) or increased drainage or redness at the wound, or calf pain,  call your surgeon's office.     Constipation Prevention    Complete by:  As directed   Drink plenty of fluids.  Prune juice may be helpful.  You may use a stool softener, such as Colace (over the counter) 100 mg twice a day.  Use MiraLax (over the counter) for constipation as needed.     Constipation Prevention    Complete by:  As directed   Drink plenty of fluids.  Prune juice may be helpful.  You may use a stool softener, such as Colace (over the counter) 100 mg twice a day.  Use MiraLax (over the counter) for constipation as needed.     Diet - low sodium heart healthy    Complete by:  As directed      Diet - low sodium heart healthy    Complete by:  As directed      Diet general    Complete by:  As directed      Driving restrictions    Complete by:  As directed   No driving while taking narcotic  pain meds.     Increase activity slowly as tolerated    Complete by:  As directed      Increase activity slowly as tolerated    Complete by:  As directed            Follow-up Information    Follow up with DUDA,MARCUS V, MD In 1 week.   Specialty:  Orthopedic Surgery   Contact information:   [address] [place] [address] [phone]        Signed: Cheral Almas 05/04/2015, 9:00 AM

## 2015-05-04 NOTE — Progress Notes (Signed)
CM received call to arrange for 3n1.  CM called [facility] DME rep, Trey PaulaJeff to please deliver the 3n1 to room so pt can be discharged.  No other CM needs were communicated.

## 2015-05-06 ENCOUNTER — Encounter (HOSPITAL_COMMUNITY): Payer: Self-pay | Admitting: Orthopedic Surgery

## 2015-10-02 DIAGNOSIS — H903 Sensorineural hearing loss, bilateral: Secondary | ICD-10-CM | POA: Diagnosis not present

## 2015-10-02 DIAGNOSIS — H6523 Chronic serous otitis media, bilateral: Secondary | ICD-10-CM | POA: Diagnosis not present

## 2015-10-02 DIAGNOSIS — H6123 Impacted cerumen, bilateral: Secondary | ICD-10-CM | POA: Diagnosis not present

## 2015-10-02 DIAGNOSIS — H90A22 Sensorineural hearing loss, unilateral, left ear, with restricted hearing on the contralateral side: Secondary | ICD-10-CM | POA: Diagnosis not present

## 2015-10-02 DIAGNOSIS — H90A31 Mixed conductive and sensorineural hearing loss, unilateral, right ear with restricted hearing on the contralateral side: Secondary | ICD-10-CM | POA: Diagnosis not present

## 2016-02-15 DIAGNOSIS — K611 Rectal abscess: Secondary | ICD-10-CM | POA: Diagnosis not present

## 2016-02-15 DIAGNOSIS — J209 Acute bronchitis, unspecified: Secondary | ICD-10-CM | POA: Diagnosis not present

## 2016-02-19 DIAGNOSIS — R05 Cough: Secondary | ICD-10-CM | POA: Diagnosis not present

## 2016-02-19 DIAGNOSIS — L0231 Cutaneous abscess of buttock: Secondary | ICD-10-CM | POA: Diagnosis not present

## 2016-02-24 DIAGNOSIS — L0231 Cutaneous abscess of buttock: Secondary | ICD-10-CM | POA: Diagnosis not present

## 2016-03-04 DIAGNOSIS — L0231 Cutaneous abscess of buttock: Secondary | ICD-10-CM | POA: Diagnosis not present

## 2016-03-04 DIAGNOSIS — Z4802 Encounter for removal of sutures: Secondary | ICD-10-CM | POA: Diagnosis not present

## 2017-01-19 IMAGING — CR DG TIBIA/FIBULA 2V*R*
4 series · 4 of 4 positions shown · non-contrast
Comparison: None.

CLINICAL DATA: Sister fibular fracture with mortise widening.
Evaluate for Masonneuve injury.

EXAM:
RIGHT TIBIA AND FIBULA - 2 VIEW

[x tib-fib ap right (1 of 2)]
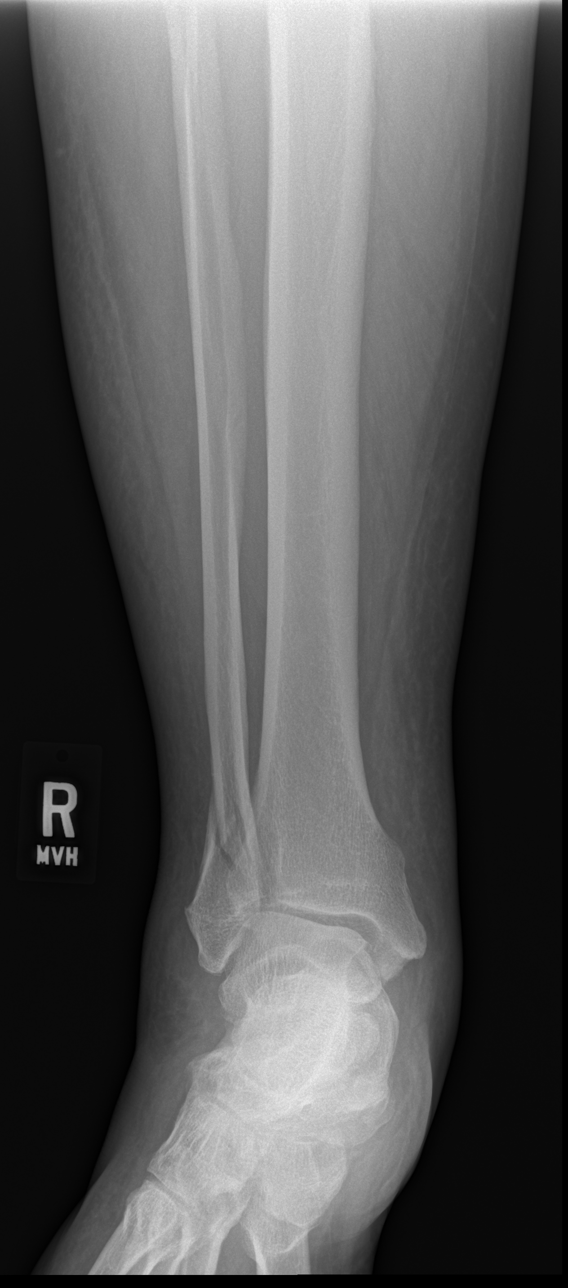

[x tib-fib lat right (1 of 2)]
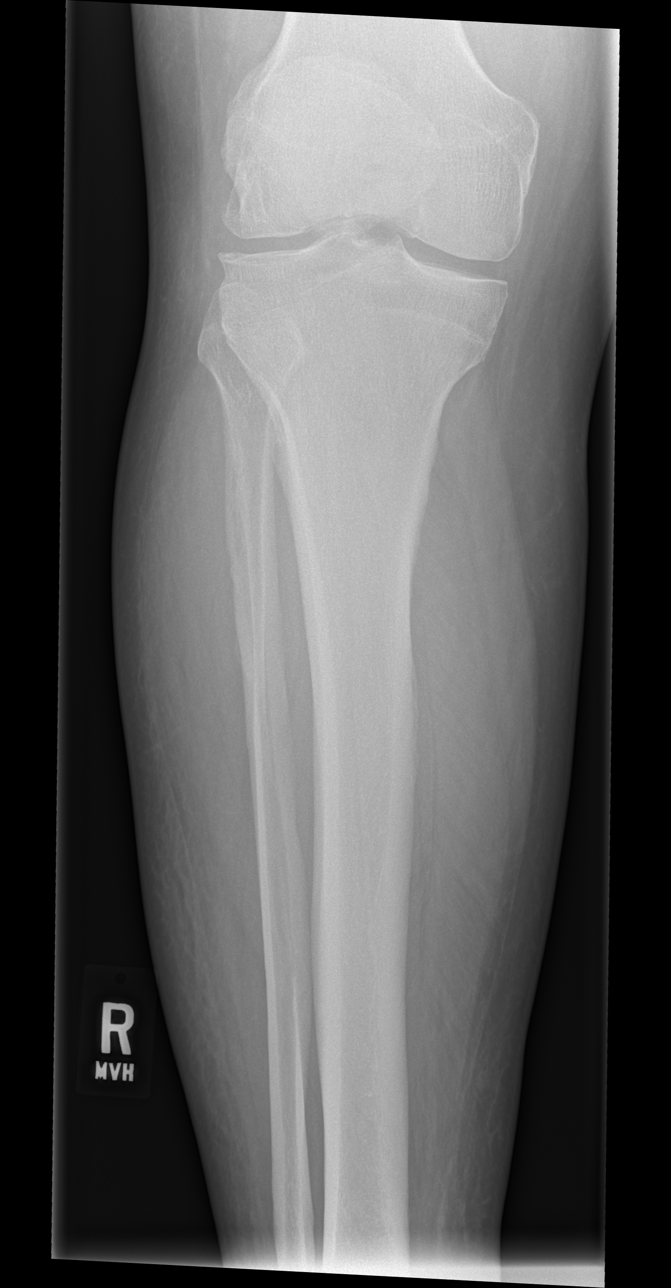

[x tib-fib ap right (2 of 2)]
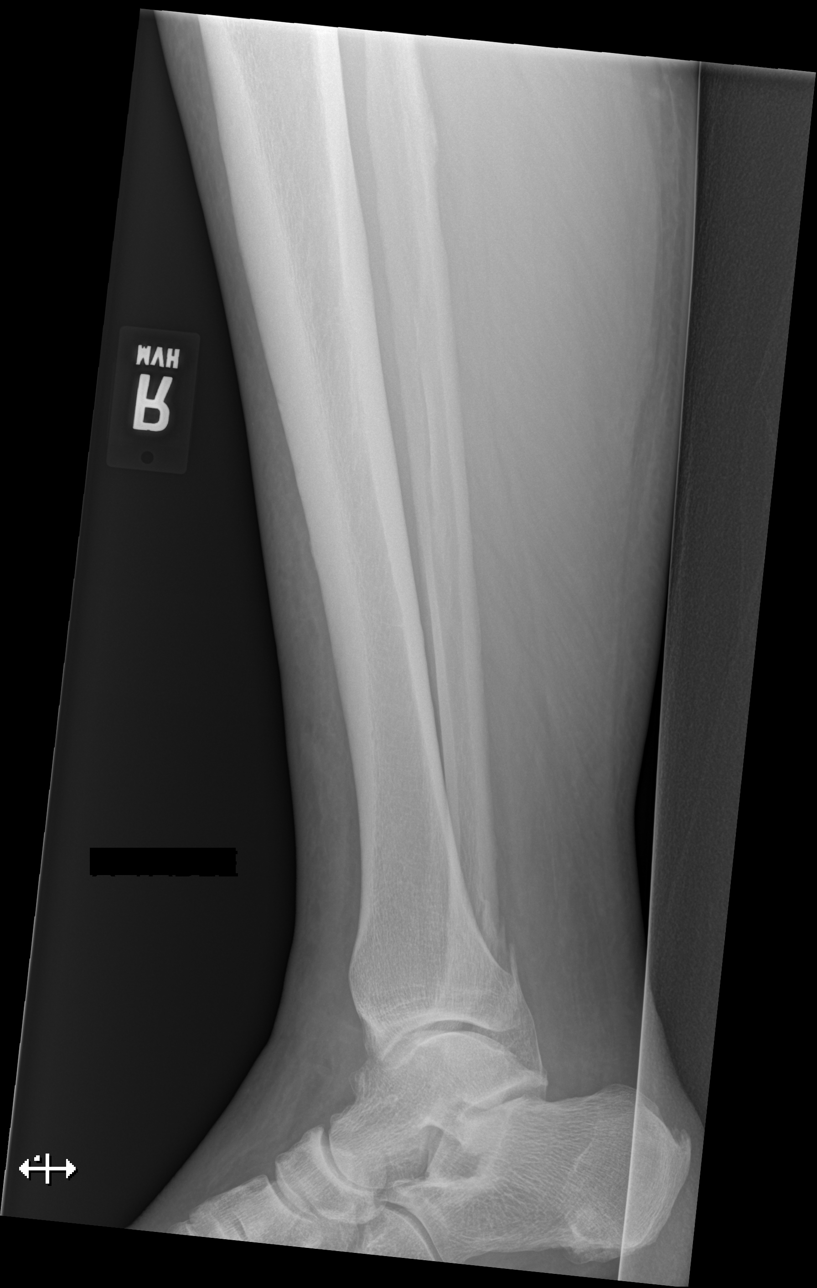

[x tib-fib lat right (2 of 2)]
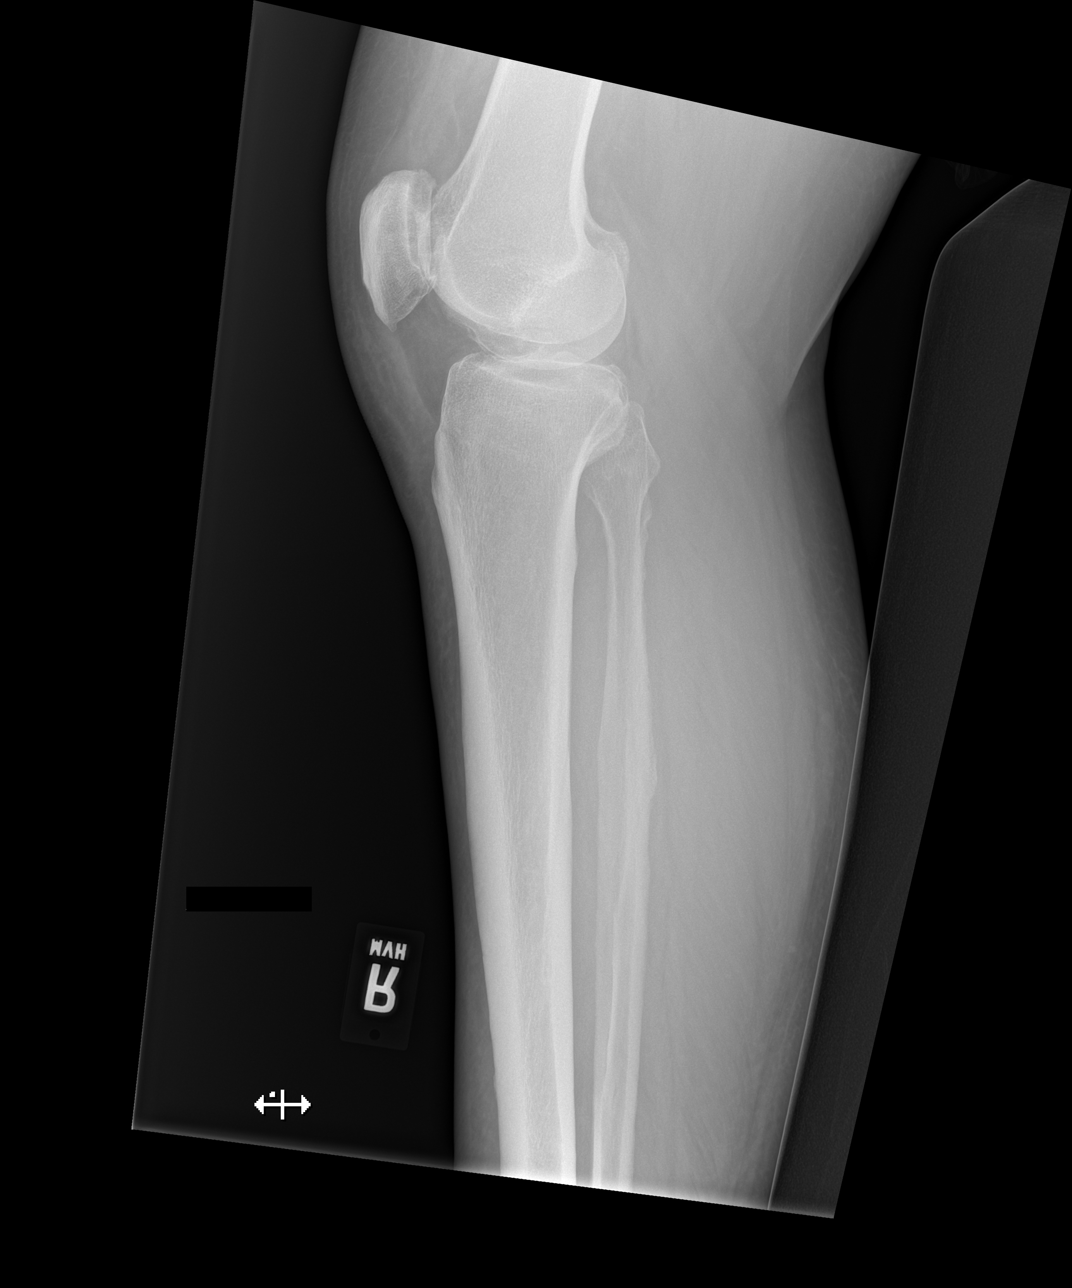

[4 of 4 positions shown; findings below may reference images not displayed]

FINDINGS: Re- demonstrated obliquely oriented minimally displaced fracture
involving the distal fibular metadiaphysis with extension to involve
the distal tib-fib joint.

This finding is without associated fracture of the more proximal
tibia or fibula.

Expected adjacent soft tissue swelling.  No radiopaque foreign body.

Small plantar calcaneal spur. Minimal enthesopathic change of the
Achilles tendon insertion site

Moderate tricompartmental degenerative change of the knee, likely
worse with the lateral compartment with joint space loss,
subchondral sclerosis and osteophytosis. There is minimal spurring
the tibial spines. No evidence of chondrocalcinosis. No joint
effusion.
IMPRESSION: 1. Re- demonstrated distal fibular metadiaphyseal fracture with
extension to the distal tib-fib joint.
2. No additional fracture involving the proximal tibia or fibula.

## 2017-02-02 DIAGNOSIS — M79604 Pain in right leg: Secondary | ICD-10-CM | POA: Diagnosis not present

## 2017-02-02 DIAGNOSIS — M79605 Pain in left leg: Secondary | ICD-10-CM | POA: Diagnosis not present

## 2017-02-02 DIAGNOSIS — Z23 Encounter for immunization: Secondary | ICD-10-CM | POA: Diagnosis not present

## 2017-02-02 DIAGNOSIS — R600 Localized edema: Secondary | ICD-10-CM | POA: Diagnosis not present

## 2017-02-09 DIAGNOSIS — H6123 Impacted cerumen, bilateral: Secondary | ICD-10-CM | POA: Diagnosis not present

## 2017-02-09 DIAGNOSIS — H6523 Chronic serous otitis media, bilateral: Secondary | ICD-10-CM | POA: Diagnosis not present

## 2017-02-09 DIAGNOSIS — H903 Sensorineural hearing loss, bilateral: Secondary | ICD-10-CM | POA: Diagnosis not present

## 2017-04-19 DIAGNOSIS — H6091 Unspecified otitis externa, right ear: Secondary | ICD-10-CM | POA: Diagnosis not present

## 2017-05-05 DIAGNOSIS — H6123 Impacted cerumen, bilateral: Secondary | ICD-10-CM | POA: Diagnosis not present

## 2017-05-05 DIAGNOSIS — H60333 Swimmer's ear, bilateral: Secondary | ICD-10-CM | POA: Diagnosis not present

## 2017-05-19 DIAGNOSIS — M545 Low back pain: Secondary | ICD-10-CM | POA: Diagnosis not present

## 2017-05-28 DIAGNOSIS — M5136 Other intervertebral disc degeneration, lumbar region: Secondary | ICD-10-CM | POA: Diagnosis not present

## 2017-06-02 DIAGNOSIS — M5136 Other intervertebral disc degeneration, lumbar region: Secondary | ICD-10-CM | POA: Diagnosis not present

## 2017-06-04 DIAGNOSIS — M5136 Other intervertebral disc degeneration, lumbar region: Secondary | ICD-10-CM | POA: Diagnosis not present

## 2017-06-07 DIAGNOSIS — M47816 Spondylosis without myelopathy or radiculopathy, lumbar region: Secondary | ICD-10-CM | POA: Diagnosis not present

## 2017-06-10 DIAGNOSIS — M5136 Other intervertebral disc degeneration, lumbar region: Secondary | ICD-10-CM | POA: Diagnosis not present

## 2017-06-11 DIAGNOSIS — M5136 Other intervertebral disc degeneration, lumbar region: Secondary | ICD-10-CM | POA: Diagnosis not present

## 2017-06-15 DIAGNOSIS — M5136 Other intervertebral disc degeneration, lumbar region: Secondary | ICD-10-CM | POA: Diagnosis not present

## 2017-06-17 DIAGNOSIS — M47816 Spondylosis without myelopathy or radiculopathy, lumbar region: Secondary | ICD-10-CM | POA: Diagnosis not present

## 2017-06-21 DIAGNOSIS — M5136 Other intervertebral disc degeneration, lumbar region: Secondary | ICD-10-CM | POA: Diagnosis not present

## 2017-06-24 DIAGNOSIS — M5136 Other intervertebral disc degeneration, lumbar region: Secondary | ICD-10-CM | POA: Diagnosis not present

## 2017-07-01 DIAGNOSIS — M5136 Other intervertebral disc degeneration, lumbar region: Secondary | ICD-10-CM | POA: Diagnosis not present

## 2017-07-02 DIAGNOSIS — M79605 Pain in left leg: Secondary | ICD-10-CM | POA: Diagnosis not present

## 2017-07-02 DIAGNOSIS — L918 Other hypertrophic disorders of the skin: Secondary | ICD-10-CM | POA: Diagnosis not present

## 2017-07-02 DIAGNOSIS — M545 Low back pain: Secondary | ICD-10-CM | POA: Diagnosis not present

## 2017-07-02 DIAGNOSIS — R600 Localized edema: Secondary | ICD-10-CM | POA: Diagnosis not present

## 2017-07-06 ENCOUNTER — Other Ambulatory Visit: Payer: Self-pay | Admitting: Family Medicine

## 2017-07-06 DIAGNOSIS — M79604 Pain in right leg: Secondary | ICD-10-CM

## 2017-07-07 DIAGNOSIS — M5136 Other intervertebral disc degeneration, lumbar region: Secondary | ICD-10-CM | POA: Diagnosis not present

## 2017-07-15 ENCOUNTER — Ambulatory Visit
Admission: RE | Admit: 2017-07-15 | Discharge: 2017-07-15 | Disposition: A | Discharge status: Home / Self Care | Payer: BLUE CROSS/BLUE SHIELD | Source: Ambulatory Visit | Attending: Family Medicine | Admitting: Family Medicine

## 2017-07-15 DIAGNOSIS — M79604 Pain in right leg: Secondary | ICD-10-CM

## 2017-07-15 DIAGNOSIS — M79661 Pain in right lower leg: Secondary | ICD-10-CM | POA: Diagnosis not present

## 2017-07-15 DIAGNOSIS — M79662 Pain in left lower leg: Secondary | ICD-10-CM | POA: Diagnosis not present

## 2017-07-16 ENCOUNTER — Telehealth: Payer: Self-pay | Admitting: *Deleted

## 2017-07-16 ENCOUNTER — Encounter: Payer: Self-pay | Admitting: Podiatry

## 2017-07-16 ENCOUNTER — Ambulatory Visit (INDEPENDENT_AMBULATORY_CARE_PROVIDER_SITE_OTHER): Payer: BLUE CROSS/BLUE SHIELD | Admitting: Podiatry

## 2017-07-16 DIAGNOSIS — B353 Tinea pedis: Secondary | ICD-10-CM | POA: Diagnosis not present

## 2017-07-16 DIAGNOSIS — R609 Edema, unspecified: Secondary | ICD-10-CM | POA: Diagnosis not present

## 2017-07-16 MED ORDER — KETOCONAZOLE 2 % EX CREA: 1.0000 "application " | TOPICAL_CREAM | Freq: Every day | CUTANEOUS | 0 refills | Status: AC

## 2017-07-16 NOTE — Progress Notes (Signed)
   Subjective:    Patient ID: Robert Calhoun, male    DOB: March 23, 1960, 57 y.o.   MRN: [id number]  HPI 57 year old male presents the office today for concerns of possible fungus to his feet.  He states that his feet did age previously but currently denies any drainage.  Denies any open sores.  He has noticed some mild swelling to his feet.  He recently had an arterial study performed yesterday.  This is been ongoing issue for about 1 year for him and denies any acute changes recently.   Review of Systems  All other systems reviewed and are negative.  History reviewed. No pertinent past medical history.  Past Surgical History:  Procedure Laterality Date  . ORIF ANKLE FRACTURE Right 05/03/2015   Procedure: OPEN REDUCTION INTERNAL FIXATION (ORIF) RIGHT ANKLE;  Surgeon: Nadara MustardMarcus Duda V, MD;  Location: [facility] OR;  Service: Orthopedics;  Laterality: Right;  . TONSILLECTOMY    . UMBILICAL HERNIA REPAIR    . UMBILICAL HERNIA REPAIR       Current Outpatient Medications:  .  amoxicillin (AMOXIL) 500 MG capsule, , Disp: , Rfl: 0 .  fluticasone (FLONASE) 50 MCG/ACT nasal spray, USE 2 SPRAY(S) IN EACH NOSTRIL ONCE DAILY AT NIGHT, Disp: , Rfl: 5 .  furosemide (LASIX) 20 MG tablet, TAKE 1 TABLET BY MOUTH ONCE DAILYPLEASE SCHEDULE FOLLOW UP VISIT, Disp: , Rfl: 0 .  HYDROcodone-acetaminophen (NORCO/VICODIN) 5-325 MG tablet, Take 2 tablets by mouth every 4 (four) hours as needed. (Patient taking differently: Take 1-2 tablets by mouth every 4 (four) hours as needed. ), Disp: 10 tablet, Rfl: 0 .  ketoconazole (NIZORAL) 2 % cream, Apply 1 application topically daily., Disp: 60 g, Rfl: 0 .  meloxicam (MOBIC) 15 MG tablet, TAKE 1 TABLET BY MOUTH ONCE DAILY IN THE MORNING WITH FOOD FOR PAIN, Disp: , Rfl: 1 .  neomycin-polymyxin-hydrocortisone (CORTISPORIN) 3.5-10000-1 OTIC suspension, , Disp: , Rfl: 0 .  oxyCODONE-acetaminophen (ROXICET) 5-325 MG tablet, Take 1 tablet by mouth every 4 (four) hours as needed for  severe pain., Disp: 60 tablet, Rfl: 0  No Known Allergies       Objective:   Physical Exam  General: AAO x3, NAD  Dermatological: There is mild evidence of tinea pedis interdigitally as well as the plantar aspect the foot.  He is more concerned about small raised bumps on the top of his feet.  This is likely more from swelling, lymphedema, changes to his feet.  These are flesh/white-colored multiple on his toes.  There is no open sores identified there is no drainage.  Vascular: Dorsalis Pedis artery and Posterior Tibial artery pedal pulses are 2/4 bilateral with immedate capillary fill time.  There is no pain with calf compression, swelling, warmth, erythema.  Mild swelling bilaterally.  Neruologic: Grossly intact via light touch bilateral.Protective threshold with Semmes Wienstein monofilament intact to all pedal sites bilateral.   Musculoskeletal: No pain, crepitus, or limitation noted with foot and ankle range of motion bilateral. Muscular strength 5/5 in all groups tested bilateral.  Gait: Unassisted, Nonantalgic.     Assessment & Plan:  57 year old male with tinea pedis, swelling bilaterally -Treatment options discussed including all alternatives, risks, and complications -Etiology of symptoms were discussed -Ketoconazole prescribed today. -Venous reflux study was ordered today due to the swelling.  I think that a lot of the skin changes are due to venous insufficiency.  Vivi BarrackMatthew R Bettejane Leavens DPM

## 2017-07-16 NOTE — Telephone Encounter (Signed)
Faxed orders to [facility]. 

## 2017-07-16 NOTE — Telephone Encounter (Signed)
-----   Message from Vivi BarrackMatthew R Wagoner, DPM sent at 07/16/2017  9:32 AM EDT ----- Can you please order a venous reflux study due to swelling? Thanks.

## 2017-07-19 ENCOUNTER — Ambulatory Visit (HOSPITAL_COMMUNITY)
Admission: RE | Admit: 2017-07-19 | Discharge: 2017-07-19 | Disposition: A | Discharge status: Home / Self Care | Payer: BLUE CROSS/BLUE SHIELD | Source: Ambulatory Visit | Attending: Family | Admitting: Family

## 2017-07-19 ENCOUNTER — Telehealth: Payer: Self-pay | Admitting: *Deleted

## 2017-07-19 DIAGNOSIS — R609 Edema, unspecified: Secondary | ICD-10-CM | POA: Insufficient documentation

## 2017-07-19 NOTE — Telephone Encounter (Signed)
Robert PikesSusan - [facility] states pt's B/L venous doppler are negative for DVT, pt was informed and will be released.

## 2017-07-22 NOTE — Telephone Encounter (Signed)
I informed pt of Dr. Gabriel RungWagoner's review of results and orders. [facility] and would like to schedule when he returns. I told pt our office made the referral and the test facility would call to schedule pt at pt's convenience and pt stated that would be fine. Orders faxed to [facility].

## 2017-07-22 NOTE — Telephone Encounter (Signed)
-----   Message from Vivi BarrackMatthew R Wagoner, DPM sent at 07/22/2017  6:50 AM EDT ----- Val- Please let him know that there is no blood clot and looks like the veins are functioning correctly based on this study. I would recommend getting a venous reflux study.

## 2017-08-17 ENCOUNTER — Ambulatory Visit (HOSPITAL_COMMUNITY)
Admission: RE | Admit: 2017-08-17 | Discharge: 2017-08-17 | Disposition: A | Discharge status: Home / Self Care | Payer: BLUE CROSS/BLUE SHIELD | Source: Ambulatory Visit | Attending: Vascular Surgery | Admitting: Vascular Surgery

## 2017-08-17 DIAGNOSIS — R609 Edema, unspecified: Secondary | ICD-10-CM | POA: Insufficient documentation

## 2017-08-18 ENCOUNTER — Telehealth: Payer: Self-pay | Admitting: *Deleted

## 2017-08-18 NOTE — Progress Notes (Signed)
Called and spoke with the patient and patient stated that he was still having some pain in the ankle area up to the leg and some in the foot and per Dr Ardelle AntonWagoner will have patient come in and get some new x-rays and see what we can do. Robert Calhoun

## 2017-08-18 NOTE — Telephone Encounter (Signed)
Patient called and per Dr Ardelle AntonWagoner the test for the blood clots were normal and the patient stated that he was still having some pain and Dr Ardelle AntonWagoner stated for the patient to come in and get a new set of x-rays. Misty StanleyLisa

## 2017-08-19 ENCOUNTER — Ambulatory Visit (INDEPENDENT_AMBULATORY_CARE_PROVIDER_SITE_OTHER): Payer: BLUE CROSS/BLUE SHIELD | Admitting: Podiatry

## 2017-08-19 ENCOUNTER — Ambulatory Visit (INDEPENDENT_AMBULATORY_CARE_PROVIDER_SITE_OTHER): Payer: BLUE CROSS/BLUE SHIELD

## 2017-08-19 DIAGNOSIS — M2141 Flat foot [pes planus] (acquired), right foot: Secondary | ICD-10-CM | POA: Diagnosis not present

## 2017-08-19 DIAGNOSIS — M19079 Primary osteoarthritis, unspecified ankle and foot: Secondary | ICD-10-CM | POA: Diagnosis not present

## 2017-08-19 DIAGNOSIS — L853 Xerosis cutis: Secondary | ICD-10-CM

## 2017-08-19 DIAGNOSIS — M779 Enthesopathy, unspecified: Secondary | ICD-10-CM | POA: Diagnosis not present

## 2017-08-19 DIAGNOSIS — M1990 Unspecified osteoarthritis, unspecified site: Secondary | ICD-10-CM

## 2017-08-19 DIAGNOSIS — M2142 Flat foot [pes planus] (acquired), left foot: Secondary | ICD-10-CM

## 2017-08-19 DIAGNOSIS — M79605 Pain in left leg: Secondary | ICD-10-CM

## 2017-08-19 MED ORDER — DICLOFENAC SODIUM 1 % TD GEL: 2.0000 g | Freq: Four times a day (QID) | TRANSDERMAL | 2 refills | Status: AC

## 2017-08-19 MED ORDER — AMMONIUM LACTATE 12 % EX CREA: TOPICAL_CREAM | CUTANEOUS | 0 refills | Status: DC | PRN

## 2017-08-19 NOTE — Progress Notes (Signed)
Subjective: 57 year old male presents the office today for concerns of continued pain to the left leg.  He states that his pain to the also aspect the left leg he points on the mid substance of the calf laterally with majority discomfort mostly with weightbearing.  He denies any recent injury or trauma this been a chronic issue for him.  Previously had arterial studies as well as venous studies.  He does have a history of right ankle fracture.  He has no other concerns.  No claudication symptoms. Denies any systemic complaints such as fevers, chills, nausea, vomiting. No acute changes since last appointment, and no other complaints at this time.   Objective: AAO x3, NAD DP/PT pulses palpable bilaterally, CRT less than 3 seconds There is tenderness palpation along the lateral aspect of the left leg on the mid substance and it appears to be more on the peroneal tendons, musculature there is no specific area pinpoint tenderness.  Upon weightbearing is a decrease in medial arch upon weightbearing.  Ankle, subtalar joint range of motion intact.  No area of tenderness to bilateral foot or ankles. Dry skin present.  No open lesions or pre-ulcerative lesions.  No pain with calf compression, swelling, warmth, erythema  Assessment: 57 year old male with left lateral leg pain  Plan: -All treatment options discussed with the patient including all alternatives, risks, complications.  -X-rays of the foot and ankle were reviewed today.  Hardware intact and a prior ankle fracture on the right side.  Arthritic changes present of the foot.  Also arthritic changes present the right ankle. -I do think that his pain is more muscular in nature however I would order an x-ray of the tib-fib of the left side to rule any osseous pathology.  Also prescribed Voltaren gel for him to use topically.  Has been taking 800 mg ibuprofen not any relief he also takes meloxicam.  Recommend him only to take either or not together.  Also  I think that trying to support his foot will be helpful and discussed arch supports.  He wants to proceed with custom inserts.  I will have him follow-up with Raiford Nobleick for this.  Also pending the x-rays will likely refer to physical therapy at his request as I do exercises at home in his own.  Will refer to [facility]. -He is also asking for prescription for moisturizer.  Prescribed AmLactin. -Patient encouraged to call the office with any questions, concerns, change in symptoms.   Vivi BarrackMatthew R Baylor Cortez DPM  Vivi BarrackMatthew R Danyetta Gillham DPM

## 2017-08-20 ENCOUNTER — Telehealth: Payer: Self-pay | Admitting: Podiatry

## 2017-08-20 NOTE — Telephone Encounter (Signed)
Pt is waiting for prior-authorization for his medication and pharmacy notified our office. The patient would like an update.

## 2017-08-24 ENCOUNTER — Telehealth: Payer: Self-pay | Admitting: Podiatry

## 2017-08-24 NOTE — Telephone Encounter (Signed)
Pt called and left message stating he called insurance and they said he has met his family out of pocket max and the orthotics are covered at 100%.

## 2017-08-25 ENCOUNTER — Telehealth: Payer: Self-pay | Admitting: Podiatry

## 2017-08-25 ENCOUNTER — Telehealth: Payer: Self-pay | Admitting: *Deleted

## 2017-08-25 NOTE — Telephone Encounter (Signed)
Forgot to enter that in the message pt said to call him if I needed to talk to him.

## 2017-08-25 NOTE — Telephone Encounter (Signed)
Discussed with patient the differences in what he was told by [employer] and what we were told on the provider line.  He will call [employer] once again and give them our case # for reference.

## 2017-08-25 NOTE — Telephone Encounter (Signed)
Pt called and left message that this is 2nd call he has made and hope he does not need to make a 3rd call. He states he called his insurance company and was on the phone 1.5 to 2 hrs with [employer] and they told him the orthotics would be covered at 100% since he has met his family out of pocket. Pt did not leave his name just his number.He wants to verify his insurance coverage for his appt Monday with [facility].

## 2017-08-26 NOTE — Telephone Encounter (Signed)
Pt states one of the medication he was able to have filled and is helping, he would like to know if it was prior authorized.

## 2017-08-26 NOTE — Telephone Encounter (Signed)
I told pt is had received his message yesterday late and was returning his call, and I needed to know which medication he was calling about PA. Pt states he has both medications and he was wanting to know if a PA had been performed or if the medication had been changed. I told pt the medications had not been changed and I did not see that a PA had been performed for either.

## 2017-08-27 ENCOUNTER — Ambulatory Visit (INDEPENDENT_AMBULATORY_CARE_PROVIDER_SITE_OTHER): Payer: BLUE CROSS/BLUE SHIELD | Admitting: Podiatry

## 2017-08-27 ENCOUNTER — Telehealth: Payer: Self-pay | Admitting: *Deleted

## 2017-08-27 DIAGNOSIS — M2141 Flat foot [pes planus] (acquired), right foot: Secondary | ICD-10-CM | POA: Diagnosis not present

## 2017-08-27 DIAGNOSIS — M2142 Flat foot [pes planus] (acquired), left foot: Secondary | ICD-10-CM

## 2017-08-27 DIAGNOSIS — M779 Enthesopathy, unspecified: Secondary | ICD-10-CM | POA: Diagnosis not present

## 2017-08-27 DIAGNOSIS — M79605 Pain in left leg: Secondary | ICD-10-CM

## 2017-08-27 NOTE — Telephone Encounter (Signed)
Pt was seen in office today by Dr. Ardelle AntonWagoner and states he has not been contacted for the tib-fib x-ray.

## 2017-08-27 NOTE — Telephone Encounter (Signed)
I informed pt of Robert Calhoun - [facility] Scheduling statement and instructions. Pt states understanding.

## 2017-08-27 NOTE — Telephone Encounter (Signed)
[facility] Scheduling states x-rays only are walk-in appts, pt may use [facility]tr[facility] and go to the 1st Floor Radiology.

## 2017-08-30 ENCOUNTER — Ambulatory Visit (INDEPENDENT_AMBULATORY_CARE_PROVIDER_SITE_OTHER): Payer: BLUE CROSS/BLUE SHIELD | Admitting: Orthotics

## 2017-08-30 DIAGNOSIS — M79605 Pain in left leg: Secondary | ICD-10-CM

## 2017-08-30 DIAGNOSIS — M2142 Flat foot [pes planus] (acquired), left foot: Secondary | ICD-10-CM

## 2017-08-30 DIAGNOSIS — M2141 Flat foot [pes planus] (acquired), right foot: Secondary | ICD-10-CM

## 2017-08-30 DIAGNOSIS — M19079 Primary osteoarthritis, unspecified ankle and foot: Secondary | ICD-10-CM

## 2017-08-30 NOTE — Progress Notes (Signed)
Subjective: 57 year old male presents the office today for concerns of continued pain to the left leg.  He states that he is going to verbal commands on the leg and it has been helpful and his pain is improved since I last saw him.  He is scheduled to see Raiford NobleRick on Monday for molding of orthotics and he wants to going to close made as well.  He did not get the x-ray performed yet.  He has no other complaints today.  Denies any systemic complaints such as fevers, chills, nausea, vomiting. No acute changes since last appointment, and no other complaints at this time.   Objective: AAO x3, NAD DP/PT pulses palpable bilaterally, CRT less than 3 seconds There is tenderness palpation along the lateral aspect of the left leg on the mid substance and it appears to be more on the peroneal tendons, musculature there is no specific area pinpoint tenderness.  Overall the pain is improved compared to what it has been.  There is no area of swelling.  Upon weightbearing is a decrease in medial arch upon weightbearing.  Ankle, subtalar joint range of motion intact.  No area of tenderness to bilateral foot or ankles. Dry skin present.  No open lesions or pre-ulcerative lesions.  No pain with calf compression, swelling, warmth, erythema  Assessment: 57 year old male with left lateral leg pain, with improvement  Plan: -All treatment options discussed with the patient including all alternatives, risks, complications.  -The Voltaren gel is been helpful for him.  Continue with this we discussed ice to the area as well as well as a warm compress.  We also discussed the orthotics he is to get molded for those on Monday.  Discussed wearing supportive shoes.  Also encouraged him to get the x-ray we gave her information on getting this with her also to follow-up on this for him.  He has no further questions today.  So far he is happy that he is improving.  Vivi BarrackMatthew R Wagoner DPM

## 2017-08-30 NOTE — Progress Notes (Signed)
Patient presents today with a hx of PTTD/AAF.  Upon assessment, patient has pronounced pes planus w/ a valgus RF deformity.  Patient has medially shifted talus/navicular.  Goal is provide longitudinal arch support and RF stability.  Plan on deep heel cup, hug arch, wide foot orthosis w/ medial flange and varus correction for RF valgus deformity.  Patient educated in the progessive nature of PTTD and financial responsibility.  

## 2017-09-14 DIAGNOSIS — F321 Major depressive disorder, single episode, moderate: Secondary | ICD-10-CM | POA: Diagnosis not present

## 2017-09-14 DIAGNOSIS — Z23 Encounter for immunization: Secondary | ICD-10-CM | POA: Diagnosis not present

## 2017-09-14 DIAGNOSIS — R400 Somnolence: Secondary | ICD-10-CM | POA: Diagnosis not present

## 2017-09-21 ENCOUNTER — Ambulatory Visit: Payer: BLUE CROSS/BLUE SHIELD | Admitting: Orthotics

## 2017-09-21 DIAGNOSIS — M2142 Flat foot [pes planus] (acquired), left foot: Secondary | ICD-10-CM

## 2017-09-21 DIAGNOSIS — M2141 Flat foot [pes planus] (acquired), right foot: Secondary | ICD-10-CM

## 2017-09-21 DIAGNOSIS — M79605 Pain in left leg: Secondary | ICD-10-CM

## 2017-09-21 DIAGNOSIS — M779 Enthesopathy, unspecified: Secondary | ICD-10-CM

## 2017-09-21 NOTE — Progress Notes (Signed)
Patient came in today to pick up custom made foot orthotics.  The goals were accomplished and the patient reported no dissatisfaction with said orthotics.  Patient was advised of breakin period and how to report any issues. 

## 2017-10-01 DIAGNOSIS — M79676 Pain in unspecified toe(s): Secondary | ICD-10-CM

## 2017-10-15 DIAGNOSIS — F321 Major depressive disorder, single episode, moderate: Secondary | ICD-10-CM | POA: Diagnosis not present

## 2017-12-03 ENCOUNTER — Other Ambulatory Visit: Payer: Self-pay | Admitting: Podiatry

## 2018-01-05 DIAGNOSIS — R7303 Prediabetes: Secondary | ICD-10-CM

## 2018-01-05 HISTORY — DX: Prediabetes: R73.03

## 2018-10-31 ENCOUNTER — Encounter (INDEPENDENT_AMBULATORY_CARE_PROVIDER_SITE_OTHER): Payer: Self-pay

## 2018-12-12 ENCOUNTER — Encounter: Payer: Managed Care, Other (non HMO) | Attending: Family Medicine | Admitting: Dietician

## 2018-12-12 ENCOUNTER — Other Ambulatory Visit: Payer: Self-pay

## 2018-12-12 ENCOUNTER — Encounter: Payer: Self-pay | Admitting: Dietician

## 2018-12-12 DIAGNOSIS — R7303 Prediabetes: Secondary | ICD-10-CM

## 2018-12-12 NOTE — Progress Notes (Signed)
Medical Nutrition Therapy  Appt Start Time: 8:25am   End Time: 9:25am  Primary concerns today: meal planning for weight management and prediabetes  Referral diagnoses: E66.01- morbid obesity;  R73.03- prediabetes;  E78.2 hyperlipidemia, mixed Preferred learning style: no preference indicated Learning readiness: contemplating   NUTRITION ASSESSMENT   Clinical Medical Hx: obesity, hyperlipidemia, prediabetes, depression, edema, hearing loss (right ear)  Labs: A1c: 5.9% (10/24/2018)   Lifestyle & Dietary Hx Patient lives with his wife. Works from home, sedentary job. States he has high blood sugar issues and is overweight but otherwise healthy.  May have McDonald's for breakfast, or sometimes pt's wife makes juevos rancheros with fried tortillas. States his wife is [place] so they eat a lot of traditional [place] food. May have enchiladas or other [place] food for lunch. For dinner, may have leftovers, a PB&J, meatloaf, or something with potatoes.  States he believes he has lactose intolerance and/or a milk allergy. Sometimes has cheese, states it is "risky" for him to eat it. Has switched to almond milk. States he thinks his portions of food are an issue.   Estimated daily fluid intake: 1/2 gallon water + other beverages Supplements: omega-3 fish oil, men 50+ MVI, vitamin B6, C Sleep: pt reports sleeps well  Current average weekly physical activity: ADLs   24-Hr Dietary Recall First Meal: cereal + almond milk + toast + juice  Snack: nuts + raisins  Second Meal: deli meat sandwich  Snack: chocolate  Third Meal: spaghetti  Snack: - Beverages: caffeine-free diet soda, orange soda, juice, almond milk, V8 juice, water   Estimated Energy Needs Calories: 2000 Carbohydrate: 225g Protein: 100g Fat: 55g   NUTRITION DIAGNOSIS  Excessive oral intake (NI-2.2) related to food and nutrition related knowledge deficit as evidenced by patient reported dietary hx and intakes of foods/beverages  which exceeds patient's estimated energy needs.     NUTRITION INTERVENTION  Nutrition education (E-1) on the following topics:  . Prediabetes  . Healthful balanced eating   Handouts Provided Include   Types of Diabetes & BG Control   MyPlate Portions   Meal Ideas  Balanced Snacks   Learning Style & Readiness for Change Teaching method utilized: Visual & Auditory  Demonstrated degree of understanding via: Teach Back  Barriers to learning/adherence to lifestyle change: wife does cooking, pt states it may be difficult to compromise   Goals Established by Pt . Measure out oil/butter when cooking  . Create more balanced meals by incorporating more vegetables    MONITORING & EVALUATION Dietary intake, weekly physical activity, and goals in 1 month.  RD's Notes for Next Visit  . 2 day sample meal plan  . Healthy swaps  . Physical activity goal  . Sugary beverages   Next Steps  Patient is to return to [facility] for follow up visit in 1 month.

## 2018-12-20 ENCOUNTER — Telehealth: Payer: Self-pay | Admitting: Podiatry

## 2018-12-20 NOTE — Telephone Encounter (Signed)
Pt called to say he had changed pharmacys now wants a;ll meds [facility] [phone]

## 2018-12-21 ENCOUNTER — Ambulatory Visit (INDEPENDENT_AMBULATORY_CARE_PROVIDER_SITE_OTHER): Payer: Managed Care, Other (non HMO) | Admitting: Otolaryngology

## 2018-12-21 ENCOUNTER — Other Ambulatory Visit: Payer: Self-pay

## 2018-12-21 ENCOUNTER — Encounter (INDEPENDENT_AMBULATORY_CARE_PROVIDER_SITE_OTHER): Payer: Self-pay | Admitting: Otolaryngology

## 2018-12-21 VITALS — Temp 97.9°F

## 2018-12-21 DIAGNOSIS — J310 Chronic rhinitis: Secondary | ICD-10-CM | POA: Diagnosis not present

## 2018-12-21 DIAGNOSIS — H9313 Tinnitus, bilateral: Secondary | ICD-10-CM | POA: Diagnosis not present

## 2018-12-21 DIAGNOSIS — H6123 Impacted cerumen, bilateral: Secondary | ICD-10-CM

## 2018-12-21 MED ORDER — FLUTICASONE PROPIONATE 50 MCG/ACT NA SUSP: 2.0000 | Freq: Every day | NASAL | 6 refills | Status: DC

## 2018-12-21 NOTE — Progress Notes (Signed)
HPI: Robert Calhoun is a 58 y.o. male who returns today for evaluation of refills for his Flonase.  He also wears bilateral hearing aids and has some wax buildup in his ears.  Right side worse than left.  No other specific complaints. He does complain of chronic tinnitus and wears bilateral hearing aids. No fevers..  Past Medical History:  Diagnosis Date  . Hyperlipidemia   . Obesity   . Prediabetes 2020   Past Surgical History:  Procedure Laterality Date  . ORIF ANKLE FRACTURE Right 05/03/2015   Procedure: OPEN REDUCTION INTERNAL FIXATION (ORIF) RIGHT ANKLE;  Surgeon: [name], MD;  Location: [facility] OR;  Service: Orthopedics;  Laterality: Right;  . TONSILLECTOMY    . UMBILICAL HERNIA REPAIR    . UMBILICAL HERNIA REPAIR     Social History   Socioeconomic History  . Marital status: Married    Spouse name: Not on file  . Number of children: Not on file  . Years of education: Not on file  . Highest education level: Not on file  Occupational History  . Not on file  Tobacco Use  . Smoking status: Never Smoker  . Smokeless tobacco: Never Used  Substance and Sexual Activity  . Alcohol use: No  . Drug use: No  . Sexual activity: Not on file  Other Topics Concern  . Not on file  Social History Narrative  . Not on file   Social Determinants of Health   Financial Resource Strain:   . Difficulty of Paying Living Expenses: Not on file  Food Insecurity:   . Worried About Programme researcher, broadcasting/film/video in the Last Year: Not on file  . Ran Out of Food in the Last Year: Not on file  Transportation Needs:   . Lack of Transportation (Medical): Not on file  . Lack of Transportation (Non-Medical): Not on file  Physical Activity:   . Days of Exercise per Week: Not on file  . Minutes of Exercise per Session: Not on file  Stress:   . Feeling of Stress : Not on file  Social Connections:   . Frequency of Communication with Friends and Family: Not on file  . Frequency of Social Gatherings with  Friends and Family: Not on file  . Attends Religious Services: Not on file  . Active Member of Clubs or Organizations: Not on file  . Attends Banker Meetings: Not on file  . Marital Status: Not on file   Family History  Problem Relation Age of Onset  . Hyperlipidemia Other   . Obesity Other    No Known Allergies Prior to Admission medications   Medication Sig Start Date End Date Taking? Authorizing Provider  ammonium lactate (AMLACTIN) 12 % cream APPLY TOPICALLY AS NEEDED FOR DRY SKIN 12/06/17  Yes [name], DPM  amoxicillin (AMOXIL) 500 MG capsule  04/28/17  Yes [provider]  diclofenac sodium (VOLTAREN) 1 % GEL Apply 2 g topically 4 (four) times daily. Rub into affected area of foot 2 to 4 times daily 08/19/17  Yes [name], DPM  fluticasone (FLONASE) 50 MCG/ACT nasal spray USE 2 SPRAY(S) IN EACH NOSTRIL ONCE DAILY AT NIGHT 06/14/17  Yes [provider]  furosemide (LASIX) 20 MG tablet TAKE 1 TABLET BY MOUTH ONCE DAILYPLEASE SCHEDULE FOLLOW UP VISIT 06/15/17  Yes [provider]  HYDROcodone-acetaminophen (NORCO/VICODIN) 5-325 MG tablet Take 2 tablets by mouth every 4 (four) hours as needed. Patient taking differently: Take 1-2 tablets by  mouth every 4 (four) hours as needed.  04/29/15  Yes [name], [name], PA-C  ketoconazole (NIZORAL) 2 % cream Apply 1 application topically daily. 07/16/17  Yes Trula Slade, DPM  meloxicam (MOBIC) 15 MG tablet TAKE 1 TABLET BY MOUTH ONCE DAILY IN THE MORNING WITH FOOD FOR PAIN 06/14/17  Yes [provider]  neomycin-polymyxin-hydrocortisone (CORTISPORIN) 3.5-10000-1 OTIC suspension  04/28/17  Yes [provider]  oxyCODONE-acetaminophen (ROXICET) 5-325 MG tablet Take 1 tablet by mouth every 4 (four) hours as needed for severe pain. 05/03/15  Yes Newt Minion, MD     Positive ROS: Otherwise negative  All other systems have been reviewed and were otherwise negative  with the exception of those mentioned in the HPI and as above.  Physical Exam: Constitutional: Alert, well-appearing, no acute distress Ears: External ears without lesions or tenderness. Ear canals with bilateral cerumen buildup and slight inflammatory changes on the right side., clear TMs.  Nasal: External nose without lesions. Septum with mild deformity.  Moderate rhinitis.. Clear nasal passages.  No signs of infection Oral: Lips and gums without lesions. Tongue and palate mucosa without lesions. Posterior oropharynx clear. Neck: No palpable adenopathy or masses Respiratory: Breathing comfortably  Skin: No facial/neck lesions or rash noted.  Cerumen impaction removal  Date/Time: 12/21/2018 12:08 PM Performed by: Rozetta Nunnery, MD Authorized by: Rozetta Nunnery, MD   Consent:    Consent obtained:  Verbal   Consent given by:  Patient   Risks discussed:  Pain and bleeding Procedure details:    Location:  L ear and R ear   Procedure type: curette and suction   Post-procedure details:    Inspection:  TM intact and canal normal   Hearing quality:  Improved   Patient tolerance of procedure:  Tolerated well, no immediate complications Comments:     Slight inflammatory changes on the right side.  I applied CSF powder on the right side only.  TMs clear bilaterally.    Assessment: Chronic rhinitis SNHL with secondary tinnitus Cerumen buildup  Plan: Refilled his Flonase in the office today Also gave him samples of Lipo flavonoid to try to see if this helps at all with tinnitus.   Radene Journey, MD

## 2019-01-09 ENCOUNTER — Ambulatory Visit: Payer: Managed Care, Other (non HMO) | Admitting: Dietician

## 2019-10-07 ENCOUNTER — Other Ambulatory Visit: Payer: Self-pay | Admitting: Podiatry

## 2019-10-08 NOTE — Telephone Encounter (Signed)
Please advise 

## 2019-10-30 ENCOUNTER — Other Ambulatory Visit (INDEPENDENT_AMBULATORY_CARE_PROVIDER_SITE_OTHER): Payer: Self-pay | Admitting: Otolaryngology

## 2020-04-15 DIAGNOSIS — R7303 Prediabetes: Secondary | ICD-10-CM | POA: Diagnosis not present

## 2020-04-15 DIAGNOSIS — E782 Mixed hyperlipidemia: Secondary | ICD-10-CM | POA: Diagnosis not present

## 2020-04-15 DIAGNOSIS — F321 Major depressive disorder, single episode, moderate: Secondary | ICD-10-CM | POA: Diagnosis not present

## 2020-08-12 ENCOUNTER — Other Ambulatory Visit (INDEPENDENT_AMBULATORY_CARE_PROVIDER_SITE_OTHER): Payer: Self-pay | Admitting: Otolaryngology

## 2020-09-27 ENCOUNTER — Other Ambulatory Visit (INDEPENDENT_AMBULATORY_CARE_PROVIDER_SITE_OTHER): Payer: Self-pay | Admitting: Otolaryngology

## 2020-09-27 MED ORDER — FLUTICASONE PROPIONATE 50 MCG/ACT NA SUSP: 2.0000 | Freq: Every day | NASAL | 6 refills | Status: AC

## 2020-11-23 ENCOUNTER — Other Ambulatory Visit: Payer: Self-pay

## 2020-11-23 ENCOUNTER — Encounter: Payer: Self-pay | Admitting: Emergency Medicine

## 2020-11-23 ENCOUNTER — Ambulatory Visit
Admission: EM | Admit: 2020-11-23 | Discharge: 2020-11-23 | Disposition: A | Discharge status: Home / Self Care | Payer: BC Managed Care – PPO | Attending: Internal Medicine | Admitting: Internal Medicine

## 2020-11-23 DIAGNOSIS — T161XXA Foreign body in right ear, initial encounter: Secondary | ICD-10-CM

## 2020-11-23 NOTE — ED Triage Notes (Signed)
Patient c/o his hearing aid "dome" stuck in his right ear since yesterday.  Unable to remove it himself.

## 2020-11-23 NOTE — ED Provider Notes (Signed)
[name]    [name]: [id number] Arrival date & time: 11/23/20  1015      History   Chief Complaint Chief Complaint  Patient presents with   Hearing aid stuck in right ear    HPI Robert Calhoun is a 60 y.o. male.   Patient presents with part of his hearing aid stuck in right ear canal that isha been present since yesterday.  Patient reports that he tried to remove it himself but has been unsuccessful.  Denies any pain or drainage from the ear.    Past Medical History:  Diagnosis Date   Hyperlipidemia    Obesity    Prediabetes 2020    Patient Active Problem List   Diagnosis Date Noted   Prediabetes 2020   Ankle fracture, lateral malleolus, closed 05/03/2015    Past Surgical History:  Procedure Laterality Date   ORIF ANKLE FRACTURE Right 05/03/2015   Procedure: OPEN REDUCTION INTERNAL FIXATION (ORIF) RIGHT ANKLE;  Surgeon: [name], MD;  Location: [facility] OR;  Service: Orthopedics;  Laterality: Right;   TONSILLECTOMY     UMBILICAL HERNIA REPAIR     UMBILICAL HERNIA REPAIR         Home Medications    Prior to Admission medications   Medication Sig Start Date End Date Taking? Authorizing Provider  ammonium lactate (AMLACTIN) 12 % cream APPLY TOPICALLY AS NEEDED FOR DRY SKIN 12/06/17  Yes [name], DPM  amoxicillin (AMOXIL) 500 MG capsule  04/28/17  Yes [provider]  diclofenac sodium (VOLTAREN) 1 % GEL Apply 2 g topically 4 (four) times daily. Rub into affected area of foot 2 to 4 times daily 08/19/17  Yes [name], DPM  fluticasone (FLONASE) 50 MCG/ACT nasal spray Instill 2 sprays in each nostril daily. 10/31/19  Yes [name], MD  fluticasone (FLONASE) 50 MCG/ACT nasal spray Place 2 sprays into both nostrils daily. 2 sprays each nostril at night 09/27/20  Yes [name], MD  furosemide (LASIX) 20 MG tablet TAKE 1 TABLET BY MOUTH ONCE DAILYPLEASE SCHEDULE FOLLOW UP VISIT 06/15/17  Yes [provider]  HYDROcodone-acetaminophen (NORCO/VICODIN) 5-325 MG tablet Take 2 tablets by mouth every 4 (four) hours as needed. Patient taking differently: Take 1-2 tablets by mouth every 4 (four) hours as needed. 04/29/15  Yes [name], [name], PA-C  ketoconazole (NIZORAL) 2 % cream Apply 1 application topically daily. 07/16/17  Yes [name], DPM  meloxicam (MOBIC) 15 MG tablet TAKE 1 TABLET BY MOUTH ONCE DAILY IN THE MORNING WITH FOOD FOR PAIN 06/14/17  Yes [provider]  neomycin-polymyxin-hydrocortisone (CORTISPORIN) 3.5-10000-1 OTIC suspension  04/28/17  Yes [provider]  oxyCODONE-acetaminophen (ROXICET) 5-325 MG tablet Take 1 tablet by mouth every 4 (four) hours as needed for severe pain. 05/03/15  Yes [name], MD    Family History Family History  Problem Relation Age of Onset   Hyperlipidemia Other    Obesity Other     Social History Social History   Tobacco Use   Smoking status: Never   Smokeless tobacco: Never  Substance Use Topics   Alcohol use: No   Drug use: No     Allergies   Patient has no known allergies.   Review of Systems Review of Systems Per HPI  Physical Exam Triage Vital Signs ED Triage Vitals [11/23/20 1255]  Enc Vitals Group     BP 134/85     Pulse Rate 74  Resp 18     Temp 97.9 F (36.6 C)     Temp Source Oral     SpO2 95 %     Weight (!) 400 lb (181.4 kg)     Height 6' (1.829 m)     Head Circumference      Peak Flow      Pain Score 0     Pain Loc      Pain Edu?      Excl. in GC?    No data found.  Updated Vital Signs BP 134/85 (BP Location: Right Arm)   Pulse 74   Temp 97.9 F (36.6 C) (Oral)   Resp 18   Ht 6' (1.829 m)   Wt (!) 400 lb (181.4 kg)   SpO2 95%   BMI 54.25 kg/m   Visual Acuity Right Eye Distance:   Left Eye Distance:   Bilateral Distance:    Right Eye Near:   Left Eye Near:    Bilateral Near:     Physical Exam Constitutional:      General: He is not  in acute distress.    Appearance: Normal appearance. He is not toxic-appearing or diaphoretic.  HENT:     Head: Normocephalic and atraumatic.     Right Ear: Tympanic membrane and external ear normal. A foreign body is present.     Ears:     Comments: Plastic dome like piece of hearing aid present in external canal on initial exam.  Was able to successfully remove foreign body with no abnormalities on physical exam including normal external canal and normal TM on second exam. Eyes:     Extraocular Movements: Extraocular movements intact.     Conjunctiva/sclera: Conjunctivae normal.  Pulmonary:     Effort: Pulmonary effort is normal.  Neurological:     General: No focal deficit present.     Mental Status: He is alert and oriented to person, place, and time. Mental status is at baseline.  Psychiatric:        Mood and Affect: Mood normal.        Behavior: Behavior normal.        Thought Content: Thought content normal.        Judgment: Judgment normal.     UC Treatments / Results  Labs (all labs ordered are listed, but only abnormal results are displayed) Labs Reviewed - No data to display  EKG   Radiology No results found.  Procedures Foreign Body Removal  Date/Time: 11/23/2020 1:10 PM Performed by: Gustavus Bryant, FNP Authorized by: Gustavus Bryant, FNP   Consent:    Consent obtained:  Verbal   Consent given by:  Patient   Risks, benefits, and alternatives were discussed: yes     Risks discussed:  Pain, infection and incomplete removal   Alternatives discussed:  No treatment Universal protocol:    Procedure explained and questions answered to patient or proxy's satisfaction: yes     Relevant documents present and verified: yes     Immediately prior to procedure, a time out was called: yes     Patient identity confirmed:  Verbally with patient and arm band Location:    Location:  Ear   Ear location:  R ear Pre-procedure details:    Neurovascular status: intact    Anesthesia:    Anesthesia method:  None Procedure details:    Removal mechanism:  Forceps   Foreign bodies recovered:  1 Post-procedure details:    Confirmation:  No additional  foreign bodies on visualization   Procedure completion:  Tolerated well, no immediate complications (including critical care time)  Medications Ordered in UC Medications - No data to display  Initial Impression / Assessment and Plan / UC Course  I have reviewed the triage vital signs and the nursing notes.  Pertinent labs & imaging results that were available during my care of the patient were reviewed by me and considered in my medical decision making (see chart for details).     Foreign body successfully removed from right ear with no immediate complications.  Advised patient to follow-up if pain occurs in the next few days.  No red flags on exam.  Discussed return precautions.  Patient verbalized understanding and was agreeable plan. Final Clinical Impressions(s) / UC Diagnoses   Final diagnoses:  Foreign body of right ear, initial encounter     Discharge Instructions      Please follow-up at urgent care if needed.    ED Prescriptions   None    PDMP not reviewed this encounter.   Gustavus Bryant, [place] 11/23/20 1312

## 2020-11-23 NOTE — Discharge Instructions (Signed)
Please follow-up at urgent care if needed.

## 2021-01-13 DIAGNOSIS — U071 COVID-19: Secondary | ICD-10-CM | POA: Diagnosis not present

## 2021-03-11 DIAGNOSIS — E782 Mixed hyperlipidemia: Secondary | ICD-10-CM | POA: Diagnosis not present

## 2021-03-11 DIAGNOSIS — R7303 Prediabetes: Secondary | ICD-10-CM | POA: Diagnosis not present

## 2021-03-11 DIAGNOSIS — F411 Generalized anxiety disorder: Secondary | ICD-10-CM | POA: Diagnosis not present

## 2021-03-11 DIAGNOSIS — R600 Localized edema: Secondary | ICD-10-CM | POA: Diagnosis not present

## 2021-04-08 DIAGNOSIS — F321 Major depressive disorder, single episode, moderate: Secondary | ICD-10-CM | POA: Diagnosis not present

## 2021-04-08 DIAGNOSIS — R7303 Prediabetes: Secondary | ICD-10-CM | POA: Diagnosis not present

## 2021-04-08 DIAGNOSIS — F411 Generalized anxiety disorder: Secondary | ICD-10-CM | POA: Diagnosis not present

## 2021-04-25 DIAGNOSIS — M5416 Radiculopathy, lumbar region: Secondary | ICD-10-CM | POA: Diagnosis not present

## 2021-04-29 ENCOUNTER — Other Ambulatory Visit: Payer: Self-pay | Admitting: Physical Medicine and Rehabilitation

## 2021-04-29 DIAGNOSIS — M5416 Radiculopathy, lumbar region: Secondary | ICD-10-CM

## 2021-09-09 DIAGNOSIS — F411 Generalized anxiety disorder: Secondary | ICD-10-CM | POA: Diagnosis not present

## 2021-09-09 DIAGNOSIS — R7303 Prediabetes: Secondary | ICD-10-CM | POA: Diagnosis not present

## 2021-09-09 DIAGNOSIS — F321 Major depressive disorder, single episode, moderate: Secondary | ICD-10-CM | POA: Diagnosis not present

## 2021-09-09 DIAGNOSIS — E782 Mixed hyperlipidemia: Secondary | ICD-10-CM | POA: Diagnosis not present

## 2022-02-26 DIAGNOSIS — E782 Mixed hyperlipidemia: Secondary | ICD-10-CM | POA: Diagnosis not present

## 2022-02-26 DIAGNOSIS — R7303 Prediabetes: Secondary | ICD-10-CM | POA: Diagnosis not present

## 2022-02-26 DIAGNOSIS — R600 Localized edema: Secondary | ICD-10-CM | POA: Diagnosis not present

## 2022-02-26 DIAGNOSIS — R32 Unspecified urinary incontinence: Secondary | ICD-10-CM | POA: Diagnosis not present

## 2022-06-23 DIAGNOSIS — H6090 Unspecified otitis externa, unspecified ear: Secondary | ICD-10-CM | POA: Diagnosis not present

## 2022-06-23 DIAGNOSIS — H6122 Impacted cerumen, left ear: Secondary | ICD-10-CM | POA: Diagnosis not present

## 2023-02-09 DIAGNOSIS — E782 Mixed hyperlipidemia: Secondary | ICD-10-CM | POA: Diagnosis not present

## 2023-02-09 DIAGNOSIS — Z23 Encounter for immunization: Secondary | ICD-10-CM | POA: Diagnosis not present

## 2023-02-09 DIAGNOSIS — R7303 Prediabetes: Secondary | ICD-10-CM | POA: Diagnosis not present

## 2023-02-09 DIAGNOSIS — F321 Major depressive disorder, single episode, moderate: Secondary | ICD-10-CM | POA: Diagnosis not present

## 2023-02-09 DIAGNOSIS — Z5181 Encounter for therapeutic drug level monitoring: Secondary | ICD-10-CM | POA: Diagnosis not present

## 2023-02-16 DIAGNOSIS — J069 Acute upper respiratory infection, unspecified: Secondary | ICD-10-CM | POA: Diagnosis not present

## 2023-02-16 DIAGNOSIS — R0602 Shortness of breath: Secondary | ICD-10-CM | POA: Diagnosis not present

## 2023-07-02 DIAGNOSIS — R7303 Prediabetes: Secondary | ICD-10-CM | POA: Diagnosis not present

## 2023-07-02 DIAGNOSIS — F321 Major depressive disorder, single episode, moderate: Secondary | ICD-10-CM | POA: Diagnosis not present

## 2023-07-02 DIAGNOSIS — E782 Mixed hyperlipidemia: Secondary | ICD-10-CM | POA: Diagnosis not present

## 2023-08-25 DIAGNOSIS — Z125 Encounter for screening for malignant neoplasm of prostate: Secondary | ICD-10-CM | POA: Diagnosis not present

## 2023-08-25 DIAGNOSIS — Z23 Encounter for immunization: Secondary | ICD-10-CM | POA: Diagnosis not present

## 2023-08-25 DIAGNOSIS — N401 Enlarged prostate with lower urinary tract symptoms: Secondary | ICD-10-CM | POA: Diagnosis not present

## 2023-08-25 DIAGNOSIS — F321 Major depressive disorder, single episode, moderate: Secondary | ICD-10-CM | POA: Diagnosis not present

## 2023-08-25 DIAGNOSIS — R600 Localized edema: Secondary | ICD-10-CM | POA: Diagnosis not present

## 2023-08-25 DIAGNOSIS — Z0000 Encounter for general adult medical examination without abnormal findings: Secondary | ICD-10-CM | POA: Diagnosis not present

## 2023-08-25 DIAGNOSIS — E782 Mixed hyperlipidemia: Secondary | ICD-10-CM | POA: Diagnosis not present

## 2023-09-19 DIAGNOSIS — Z1211 Encounter for screening for malignant neoplasm of colon: Secondary | ICD-10-CM | POA: Diagnosis not present

## 2023-09-19 DIAGNOSIS — Z1212 Encounter for screening for malignant neoplasm of rectum: Secondary | ICD-10-CM | POA: Diagnosis not present

## 2023-10-05 DIAGNOSIS — R195 Other fecal abnormalities: Secondary | ICD-10-CM | POA: Diagnosis not present

## 2023-12-08 ENCOUNTER — Other Ambulatory Visit: Payer: Self-pay | Admitting: Gastroenterology

## 2023-12-21 DIAGNOSIS — E782 Mixed hyperlipidemia: Secondary | ICD-10-CM | POA: Diagnosis not present

## 2023-12-21 DIAGNOSIS — E1169 Type 2 diabetes mellitus with other specified complication: Secondary | ICD-10-CM | POA: Diagnosis not present

## 2023-12-21 DIAGNOSIS — F321 Major depressive disorder, single episode, moderate: Secondary | ICD-10-CM | POA: Diagnosis not present

## 2023-12-21 DIAGNOSIS — R600 Localized edema: Secondary | ICD-10-CM | POA: Diagnosis not present

## 2023-12-21 DIAGNOSIS — Z23 Encounter for immunization: Secondary | ICD-10-CM | POA: Diagnosis not present

## 2024-01-04 DIAGNOSIS — T161XXA Foreign body in right ear, initial encounter: Secondary | ICD-10-CM | POA: Diagnosis not present

## 2024-01-04 DIAGNOSIS — H9193 Unspecified hearing loss, bilateral: Secondary | ICD-10-CM | POA: Diagnosis not present

## 2024-01-04 DIAGNOSIS — T162XXA Foreign body in left ear, initial encounter: Secondary | ICD-10-CM | POA: Diagnosis not present

## 2024-01-05 ENCOUNTER — Encounter (HOSPITAL_COMMUNITY): Payer: Self-pay | Admitting: Gastroenterology

## 2024-01-05 NOTE — Progress Notes (Signed)
 Attempted to obtain medical history for pre op call via telephone, unable to reach at this time. HIPAA compliant voicemail message left requesting return call to pre surgical testing department.

## 2024-01-11 ENCOUNTER — Ambulatory Visit (INDEPENDENT_AMBULATORY_CARE_PROVIDER_SITE_OTHER): Admitting: Podiatry

## 2024-01-11 ENCOUNTER — Telehealth: Payer: Self-pay | Admitting: Lab

## 2024-01-11 DIAGNOSIS — M79674 Pain in right toe(s): Secondary | ICD-10-CM | POA: Diagnosis not present

## 2024-01-11 DIAGNOSIS — M2042 Other hammer toe(s) (acquired), left foot: Secondary | ICD-10-CM

## 2024-01-11 DIAGNOSIS — M2041 Other hammer toe(s) (acquired), right foot: Secondary | ICD-10-CM | POA: Diagnosis not present

## 2024-01-11 DIAGNOSIS — E119 Type 2 diabetes mellitus without complications: Secondary | ICD-10-CM | POA: Diagnosis not present

## 2024-01-11 DIAGNOSIS — R609 Edema, unspecified: Secondary | ICD-10-CM

## 2024-01-11 DIAGNOSIS — R0989 Other specified symptoms and signs involving the circulatory and respiratory systems: Secondary | ICD-10-CM

## 2024-01-11 DIAGNOSIS — E1149 Type 2 diabetes mellitus with other diabetic neurological complication: Secondary | ICD-10-CM

## 2024-01-11 DIAGNOSIS — L84 Corns and callosities: Secondary | ICD-10-CM | POA: Diagnosis not present

## 2024-01-11 DIAGNOSIS — Z79899 Other long term (current) drug therapy: Secondary | ICD-10-CM

## 2024-01-11 DIAGNOSIS — Q666 Other congenital valgus deformities of feet: Secondary | ICD-10-CM

## 2024-01-11 DIAGNOSIS — Z0189 Encounter for other specified special examinations: Secondary | ICD-10-CM | POA: Diagnosis not present

## 2024-01-11 DIAGNOSIS — M79675 Pain in left toe(s): Secondary | ICD-10-CM

## 2024-01-11 DIAGNOSIS — B351 Tinea unguium: Secondary | ICD-10-CM

## 2024-01-11 MED ORDER — AMMONIUM LACTATE 12 % EX CREA: 1.0000 | TOPICAL_CREAM | CUTANEOUS | 2 refills | Status: DC | PRN

## 2024-01-11 NOTE — Progress Notes (Signed)
 "  Subjective:  Patient ID: Robert Calhoun, male    DOB: 1960-02-20,  MRN: [id number]  Chief Complaint  Patient presents with   Diabetes Mellitus    NIDDM patient presents today to establish care for [facility] patient is interested in compression socks and orthotics.    Discussed the use of AI scribe software for clinical note transcription with the patient, who gave verbal consent to proceed.  History of Present Illness Robert Calhoun is a 64 year old male with type 2 diabetes mellitus who presents for evaluation of diabetic foot symptoms.  He reports chronic tingling, decreased sensation, and persistent coldness in both feet since his diabetes diagnosis. He denies pain, ulceration, or recent trauma. He notes chronic bilateral pedal edema that he feels is due to fluid retention.  He has difficulty trimming his toenails due to thick, dystrophic nails and is interested in oral antifungal therapy.   He reports xerosis of the feet that previously improved with Amlactin and requests a refill.  He has longstanding calluses on the halluces and hallux valgus with lateral deviation of the great toes. He is concerned about increased foot width and functional impact of the deformity and asks about treatment options. He currently uses custom insoles and requests new measurements for two pairs of shoes and for compression socks for his lower extremity edema.      Objective:  There were no vitals filed for this visit.  Physical Exam General: AAO x3, NAD  Dermatological: Nails are hypertrophic, dystrophic, brittle, discolored, elongated 10. No surrounding redness or drainage. Tenderness nails 1-5 bilaterally.  Hyperkeratotic lesions medial hallux bilaterally.  No underlying ulceration, drainage or signs of infection.  Dry skin present bilaterally.   Vascular: Dorsalis Pedis artery and Posterior Tibial artery pedal pulses are 1/4 bilateral with immedate capillary fill time, however could be  decreased given edema to the lower extremities.  Chronic bilateral lower extremity edema.  No erythema, warmth.  There is no pain with calf compression, warmth, erythema.   Neruologic: Sensation decreased since months to monofilament  Musculoskeletal: Digital contractures present.     No images are attached to the encounter.    Results    Assessment:   1. Swelling   2. Long-term use of high-risk medication   3. Decreased pedal pulses   4. Dermatophytosis of nail   5. Pain in toes of both feet   6. Callus   7. Type II diabetes mellitus with neurological manifestations (HCC)   8. Encounter for diabetic foot exam [facility])      Plan:  Patient was evaluated and treated and all questions answered.  Assessment and Plan Assessment & Plan Type 2 diabetes mellitus with diabetic polyneuropathy Chronic diabetes with neuropathy symptoms. Glycemic control crucial to prevent progression. - Performed diabetic foot exam, assessed sensation and vascular status. - Emphasized importance of glycemic control. - Reviewed gabapentin for neuropathy, deferred initiation. - Scheduled foot checks and nail trims every three months.  Symptomatic onychomycosis Chronic fungal toenail infection, prefers oral therapy. -Serpe debrided nails x 10 without any complications or bleeding. - Discussed oral terbinafine  and topical agents, preferred oral. - Reviewed oral terbinafine  risks: hepatotoxicity, rash, GI upset, myalgias, dysgeusia. - Ordered baseline liver function tests before terbinafine . - Planned terbinafine  initiation post-normal liver function. - Trimmed and filed toenails.  Lower extremity edema Chronic edema, likely multifactorial, vascular insufficiency - Ordered vascular ultrasound to assess circulation. - Prescribed compression socks, provided medical supply store prescription.  Corns and calluses of feet Chronic  calluses, likely due to gait and footwear. - Trimmed calluses without any  complications or bleeding x 2 - Discussed supportive footwear, shoe inserts. - Evaluated custom insoles, need replacement. - Arranged new measurements for insoles.  Hallux valgus/hammertoes Chronic hallux valgus. - Discussed toe spacers. - Provided toe spacer for trial. - Patient was to proceed with new orthotics.  He was measured for inserts today.  Dry skin of feet Chronic xerosis, previously responsive to moisturizer. - Prescribed ammonium lactate  (Amlactin) moisturizer, sent prescription. - Educated on moisturizer use for foot care.    No follow-ups on file.   Donnice JONELLE Fees DPM  "

## 2024-01-11 NOTE — Telephone Encounter (Signed)
 Patient states medication was sent to wrong pharmacy should have went to [facility] .

## 2024-01-12 ENCOUNTER — Other Ambulatory Visit: Payer: Self-pay | Admitting: Podiatry

## 2024-01-12 ENCOUNTER — Ambulatory Visit: Payer: Self-pay | Admitting: Podiatry

## 2024-01-12 DIAGNOSIS — Z79899 Other long term (current) drug therapy: Secondary | ICD-10-CM

## 2024-01-12 LAB — HEPATIC FUNCTION PANEL
ALT: 15 IU/L (ref 0–44)
AST: 17 IU/L (ref 0–40)
Albumin: 4.1 g/dL (ref 3.9–4.9)
Alkaline Phosphatase: 53 IU/L (ref 47–123)
Bilirubin Total: 0.5 mg/dL (ref 0.0–1.2)
Bilirubin, Direct: 0.12 mg/dL (ref 0.00–0.40)
Total Protein: 7.7 g/dL (ref 6.0–8.5)

## 2024-01-12 LAB — CBC WITH DIFFERENTIAL/PLATELET
Basophils Absolute: 0 x10E3/uL (ref 0.0–0.2)
Basos: 1 %
EOS (ABSOLUTE): 0.2 x10E3/uL (ref 0.0–0.4)
Eos: 3 %
Hematocrit: 54.6 % — ABNORMAL HIGH (ref 37.5–51.0)
Hemoglobin: 17.8 g/dL — ABNORMAL HIGH (ref 13.0–17.7)
Immature Grans (Abs): 0 x10E3/uL (ref 0.0–0.1)
Immature Granulocytes: 0 %
Lymphocytes Absolute: 2 x10E3/uL (ref 0.7–3.1)
Lymphs: 27 %
MCH: 30 pg (ref 26.6–33.0)
MCHC: 32.6 g/dL (ref 31.5–35.7)
MCV: 92 fL (ref 79–97)
Monocytes Absolute: 0.5 x10E3/uL (ref 0.1–0.9)
Monocytes: 7 %
Neutrophils Absolute: 4.5 x10E3/uL (ref 1.4–7.0)
Neutrophils: 62 %
Platelets: 178 x10E3/uL (ref 150–450)
RBC: 5.93 x10E6/uL — ABNORMAL HIGH (ref 4.14–5.80)
RDW: 13.7 % (ref 11.6–15.4)
WBC: 7.3 x10E3/uL (ref 3.4–10.8)

## 2024-01-12 MED ORDER — TERBINAFINE HCL 250 MG PO TABS: 250.0000 mg | ORAL_TABLET | Freq: Every day | ORAL | 0 refills | Status: AC

## 2024-01-13 ENCOUNTER — Other Ambulatory Visit: Payer: Self-pay | Admitting: Podiatry

## 2024-01-13 ENCOUNTER — Telehealth: Payer: Self-pay

## 2024-01-13 MED ORDER — AMMONIUM LACTATE 12 % EX CREA: TOPICAL_CREAM | CUTANEOUS | 1 refills | Status: AC

## 2024-01-13 NOTE — Telephone Encounter (Signed)
 [facility] pharmacy called stating they need clarification on Amlactin prescription. They need to know the frequency of use (once, twice etc..). They also need to know the quantity per application. There is a max that the patient is able to use on this prescription. Prescription can be re-sent or can call pharmacy at [phone] with updated information.

## 2024-01-17 ENCOUNTER — Encounter (HOSPITAL_COMMUNITY): Payer: Self-pay | Admitting: Gastroenterology

## 2024-01-17 NOTE — Telephone Encounter (Signed)
 Can we fax the order for compression socks to [facility]? I am not sure if they do them or not.

## 2024-01-18 ENCOUNTER — Ambulatory Visit (HOSPITAL_COMMUNITY): Admitting: Anesthesiology

## 2024-01-18 ENCOUNTER — Encounter (HOSPITAL_COMMUNITY)
Admission: RE | Disposition: A | Discharge status: Home / Self Care | Payer: Self-pay | Source: Home / Self Care | Attending: Gastroenterology

## 2024-01-18 ENCOUNTER — Encounter (HOSPITAL_COMMUNITY): Payer: Self-pay | Admitting: Gastroenterology

## 2024-01-18 ENCOUNTER — Other Ambulatory Visit: Payer: Self-pay | Admitting: Gastroenterology

## 2024-01-18 ENCOUNTER — Ambulatory Visit (HOSPITAL_COMMUNITY)
Admission: RE | Admit: 2024-01-18 | Discharge: 2024-01-18 | Disposition: A | Discharge status: Home / Self Care | Attending: Gastroenterology | Admitting: Gastroenterology

## 2024-01-18 ENCOUNTER — Other Ambulatory Visit: Payer: Self-pay

## 2024-01-18 DIAGNOSIS — Z1211 Encounter for screening for malignant neoplasm of colon: Secondary | ICD-10-CM | POA: Insufficient documentation

## 2024-01-18 DIAGNOSIS — E6689 Other obesity not elsewhere classified: Secondary | ICD-10-CM | POA: Diagnosis not present

## 2024-01-18 DIAGNOSIS — R195 Other fecal abnormalities: Secondary | ICD-10-CM

## 2024-01-18 DIAGNOSIS — E119 Type 2 diabetes mellitus without complications: Secondary | ICD-10-CM | POA: Insufficient documentation

## 2024-01-18 DIAGNOSIS — K635 Polyp of colon: Secondary | ICD-10-CM | POA: Diagnosis not present

## 2024-01-18 DIAGNOSIS — Z7984 Long term (current) use of oral hypoglycemic drugs: Secondary | ICD-10-CM | POA: Insufficient documentation

## 2024-01-18 DIAGNOSIS — K621 Rectal polyp: Secondary | ICD-10-CM | POA: Diagnosis not present

## 2024-01-18 DIAGNOSIS — D123 Benign neoplasm of transverse colon: Secondary | ICD-10-CM | POA: Insufficient documentation

## 2024-01-18 DIAGNOSIS — I10 Essential (primary) hypertension: Secondary | ICD-10-CM | POA: Insufficient documentation

## 2024-01-18 DIAGNOSIS — D122 Benign neoplasm of ascending colon: Secondary | ICD-10-CM | POA: Diagnosis not present

## 2024-01-18 DIAGNOSIS — K5730 Diverticulosis of large intestine without perforation or abscess without bleeding: Secondary | ICD-10-CM | POA: Insufficient documentation

## 2024-01-18 DIAGNOSIS — K648 Other hemorrhoids: Secondary | ICD-10-CM | POA: Diagnosis not present

## 2024-01-18 DIAGNOSIS — Z6841 Body Mass Index (BMI) 40.0 and over, adult: Secondary | ICD-10-CM | POA: Diagnosis not present

## 2024-01-18 HISTORY — PX: COLONOSCOPY: SHX5424

## 2024-01-18 HISTORY — PX: POLYPECTOMY: SHX149

## 2024-01-18 LAB — GLUCOSE, CAPILLARY
Glucose-Capillary: 91 mg/dL (ref 70–99)
Glucose-Capillary: 96 mg/dL (ref 70–99)

## 2024-01-18 MED ORDER — LIDOCAINE 2% (20 MG/ML) 5 ML SYRINGE
INTRAMUSCULAR | Status: DC | PRN
  Administered 2024-01-18: 100 mg via INTRAVENOUS

## 2024-01-18 MED ORDER — PROPOFOL 500 MG/50ML IV EMUL
INTRAVENOUS | Status: DC | PRN
  Administered 2024-01-18: 50 mg via INTRAVENOUS
  Administered 2024-01-18: 125 ug/kg/min via INTRAVENOUS
  Administered 2024-01-18: 50 mg via INTRAVENOUS

## 2024-01-18 MED ORDER — SODIUM CHLORIDE 0.9 % IV SOLN: INTRAVENOUS | Status: DC

## 2024-01-18 NOTE — Anesthesia Preprocedure Evaluation (Addendum)
"                                    Anesthesia Evaluation  Patient identified by MRN, date of birth, ID band Patient awake    Reviewed: Allergy & Precautions, NPO status , Patient's Chart, lab work & pertinent test results  Airway Mallampati: IV  TM Distance: <3 FB Neck ROM: Full    Dental  (+) Dental Advisory Given, Poor Dentition, Missing   Pulmonary neg pulmonary ROS   Pulmonary exam normal breath sounds clear to auscultation       Cardiovascular hypertension, Pt. on medications Normal cardiovascular exam Rhythm:Regular Rate:Normal     Neuro/Psych negative neurological ROS     GI/Hepatic negative GI ROS, Neg liver ROS,,,  Endo/Other  diabetes, Type 2, Oral Hypoglycemic Agents  Class 4 obesity (BMI 60)  Renal/GU negative Renal ROS     Musculoskeletal negative musculoskeletal ROS (+)    Abdominal   Peds  Hematology negative hematology ROS (+)   Anesthesia Other Findings Day of surgery medications reviewed with the patient.  Reproductive/Obstetrics                              Anesthesia Physical Anesthesia Plan  ASA: 4  Anesthesia Plan: MAC   Post-op Pain Management:    Induction: Intravenous  PONV Risk Score and Plan: 1 and TIVA and Treatment may vary due to age or medical condition  Airway Management Planned: Natural Airway and Simple Face Mask  Additional Equipment:   Intra-op Plan:   Post-operative Plan:   Informed Consent: I have reviewed the patients History and Physical, chart, labs and discussed the procedure including the risks, benefits and alternatives for the proposed anesthesia with the patient or authorized representative who has indicated his/her understanding and acceptance.     Dental advisory given  Plan Discussed with: CRNA and Anesthesiologist  Anesthesia Plan Comments:          Anesthesia Quick Evaluation  "

## 2024-01-18 NOTE — Op Note (Addendum)
 [facility] Patient Name: Robert Calhoun Procedure Date: 01/18/2024 MRN: [id number] Attending MD: Layla Lah , MD, [id number] Date of Birth: [birth year] [name]: [id number] Age: 64 Admit Type: Outpatient Procedure:                Colonoscopy Indications:              This is the patient's first colonoscopy, Positive                            Cologuard test Providers:                Layla Lah, MD, Hoy Penner, RN, Corene Southgate, Technician Referring MD:              Medicines:                Sedation Administered by an Anesthesia Professional Complications:            No immediate complications. Estimated Blood Loss:     Estimated blood loss was minimal. Procedure:                Pre-Anesthesia Assessment:                           - Prior to the procedure, a History and Physical                            was performed, and patient medications and                            allergies were reviewed. The patient's tolerance of                            previous anesthesia was also reviewed. The risks                            and benefits of the procedure and the sedation                            options and risks were discussed with the patient.                            All questions were answered, and informed consent                            was obtained. Prior Anticoagulants: The patient has                            taken no anticoagulant or antiplatelet agents. ASA                            Grade Assessment: IV - A patient with severe  systemic disease that is a constant threat to life.                            After reviewing the risks and benefits, the patient                            was deemed in satisfactory condition to undergo the                            procedure.                           After obtaining informed consent, the colonoscope                            was passed  under direct vision. Throughout the                            procedure, the patient's blood pressure, pulse, and                            oxygen saturations were monitored continuously. The                            [id number]-[id number] ([id number]) colonoscope was introduced                            through the anus with the intention of advancing to                            the cecum. The scope was advanced to the ascending                            colon before the procedure was aborted. Medications                            were given. The rectum was photographed. The                            colonoscopy was technically difficult and complex                            due to the patient's body habitus. Successful                            completion of the procedure was aided by applying                            abdominal pressure. The patient tolerated the                            procedure well. The quality of the bowel  preparation was fair. Scope In: 9:38:51 AM Scope Out: 10:06:50 AM Scope Withdrawal Time: 0 hours 10 minutes 9 seconds  Total Procedure Duration: 0 hours 27 minutes 59 seconds  Findings:      Hemorrhoids were found on perianal exam.      The colon (entire examined portion) revealed significantly excessive       looping. Advancing the scope required straightening and shortening the       scope to obtain bowel loop reduction and applying abdominal pressure.       The scope was advanced to the ascending colon before the procedure was       aborted      Two sessile polyps were found in the transverse colon and ascending       colon. The polyps were 4 to 7 mm in size. These polyps were removed with       a cold snare. Resection and retrieval were complete.      Two sessile polyps were found in the sigmoid colon. The polyps were 4 to       6 mm in size. These polyps were removed with a cold snare. Resection and       retrieval were  complete.      Multiple diverticula were found in the entire colon.      A small polyp was found in the rectum. The polyp was sessile. The polyp       was removed with a cold snare. Resection and retrieval were complete.      Internal hemorrhoids were found during retroflexion. The hemorrhoids       were medium-sized. Impression:               - Preparation of the colon was fair.                           - Hemorrhoids found on perianal exam.                           - There was significant looping of the colon.                           - Two 4 to 7 mm polyps in the transverse colon and                            in the ascending colon, removed with a cold snare.                            Resected and retrieved.                           - Two 4 to 6 mm polyps in the sigmoid colon,                            removed with a cold snare. Resected and retrieved.                           - Diverticulosis in the entire examined colon.                           -  One small polyp in the rectum, removed with a                            cold snare. Resected and retrieved.                           - Internal hemorrhoids. Moderate Sedation:      Moderate (conscious) sedation was personally administered by an       anesthesia professional. The following parameters were monitored: oxygen       saturation, heart rate, blood pressure, and response to care. Recommendation:           - Patient has a contact number available for                            emergencies. The signs and symptoms of potential                            delayed complications were discussed with the                            patient. Return to normal activities tomorrow.                            Written discharge instructions were provided to the                            patient.                           - Resume previous diet.                           - Continue present medications.                           - Await  pathology results.                           - Repeat colonoscopy date to be determined after                            pending pathology results are reviewed for                            surveillance based on pathology results.                           - Perform a virtual colonoscopy at appointment to                            be scheduled. Procedure Code(s):        --- Professional ---                           [phone], 52, Colonoscopy, flexible; with removal of  tumor(s), polyp(s), or other lesion(s) by snare                            technique Diagnosis Code(s):        --- Professional ---                           K64.8, Other hemorrhoids                           D12.3, Benign neoplasm of transverse colon (hepatic                            flexure or splenic flexure)                           D12.2, Benign neoplasm of ascending colon                           D12.5, Benign neoplasm of sigmoid colon                           R19.5, Other fecal abnormalities                           K57.30, Diverticulosis of large intestine without                            perforation or abscess without bleeding CPT copyright 2022 [employer]. All rights reserved. The codes documented in this report are preliminary and upon coder review may  be revised to meet current compliance requirements. Layla Lah, MD Layla Lah, MD 01/18/2024 10:17:37 AM Number of Addenda: 0

## 2024-01-18 NOTE — Anesthesia Postprocedure Evaluation (Signed)
"   Anesthesia Post Note  Patient: Robert Calhoun  Procedure(s) Performed: COLONOSCOPY POLYPECTOMY, INTESTINE     Patient location during evaluation: Endoscopy Anesthesia Type: MAC Level of consciousness: oriented, awake and alert and awake Pain management: pain level controlled Vital Signs Assessment: post-procedure vital signs reviewed and stable Respiratory status: spontaneous breathing, nonlabored ventilation and respiratory function stable Cardiovascular status: blood pressure returned to baseline and stable Postop Assessment: no headache, no backache and no apparent nausea or vomiting Anesthetic complications: no   No notable events documented.  Last Vitals:  Vitals:   01/18/24 1030 01/18/24 1035  BP: (!) 140/60 122/60  Pulse: 86 84  Resp: (!) 27 17  Temp:    SpO2: 90% 93%    Last Pain:  Vitals:   01/18/24 1035  TempSrc:   PainSc: 0-No pain                 [name]      "

## 2024-01-18 NOTE — H&P (Signed)
 " Primary Care Physician:  Sun, Vyvyan, MD Primary Gastroenterologist:  Dr. Elicia  Reason for Visit -outpatient colonoscopy for positive Cologuard  HPI: Robert Calhoun is a 64 y.o. male here for outpatient colonoscopy for positive Cologuard. Past medical history of morbid obesity with BMI more than 50. He is complaining of occasional constipation and takes docusate as needed.  He has been seeing intermittent blood in the stool in the past which he has attributed to hemorrhoids.  Denies any other GI symptoms.  No previous colonoscopy. No family history of colon cancer.  Past Medical History:  Diagnosis Date   Hyperlipidemia    Obesity    Prediabetes 2020    Past Surgical History:  Procedure Laterality Date   ORIF ANKLE FRACTURE Right 05/03/2015   Procedure: OPEN REDUCTION INTERNAL FIXATION (ORIF) RIGHT ANKLE;  Surgeon: Jerona Harden GAILS, MD;  Location: [facility] OR;  Service: Orthopedics;  Laterality: Right;   TONSILLECTOMY     UMBILICAL HERNIA REPAIR     UMBILICAL HERNIA REPAIR      Prior to Admission medications  Medication Sig Start Date End Date Taking? Authorizing Provider  ammonium lactate  (AMLACTIN) 12 % cream Apply to dry skin on feet twice a day. Cover affected area with a thin layer. 01/13/24  Yes Gershon Donnice SAUNDERS, DPM  furosemide (LASIX) 20 MG tablet TAKE 1 TABLET BY MOUTH ONCE DAILYPLEASE SCHEDULE FOLLOW UP VISIT 06/15/17  Yes [provider]  lisinopril (ZESTRIL) 10 MG tablet Take 10 mg by mouth daily.   Yes [provider]  metFORMIN (GLUCOPHAGE) 500 MG tablet Take 500 mg by mouth 2 (two) times daily with a meal.   Yes [provider]  rosuvastatin (CRESTOR) 10 MG tablet Take 10 mg by mouth daily.   Yes [provider]  amoxicillin (AMOXIL) 500 MG capsule  04/28/17   [provider]  diclofenac  sodium (VOLTAREN ) 1 % GEL Apply 2 g topically 4 (four) times daily. Rub into affected area of foot 2 to 4 times daily 08/19/17   Gershon Donnice SAUNDERS, DPM  fluticasone  (FLONASE ) 50 MCG/ACT nasal spray Instill 2 sprays in each nostril daily. 10/31/19   Ethyl Lonni BRAVO, MD  fluticasone  (FLONASE ) 50 MCG/ACT nasal spray Place 2 sprays into both nostrils daily. 2 sprays each nostril at night 09/27/20   Ethyl Lonni BRAVO, MD  HYDROcodone -acetaminophen  (NORCO/VICODIN) 5-325 MG tablet Take 2 tablets by mouth every 4 (four) hours as needed. Patient taking differently: Take 1-2 tablets by mouth every 4 (four) hours as needed. 04/29/15   [name], [name], PA-C  ketoconazole  (NIZORAL ) 2 % cream Apply 1 application topically daily. 07/16/17   Gershon Donnice SAUNDERS, DPM  meloxicam (MOBIC) 15 MG tablet TAKE 1 TABLET BY MOUTH ONCE DAILY IN THE MORNING WITH FOOD FOR PAIN 06/14/17   [provider]  neomycin-polymyxin-hydrocortisone (CORTISPORIN) 3.5-10000-1 OTIC suspension  04/28/17   [provider]  oxyCODONE -acetaminophen  (ROXICET) 5-325 MG tablet Take 1 tablet by mouth every 4 (four) hours as needed for severe pain. 05/03/15   Harden Jerona GAILS, MD  terbinafine  (LAMISIL ) 250 MG tablet Take 1 tablet (250 mg total) by mouth daily. 01/12/24   Gershon Donnice SAUNDERS, DPM    Scheduled Meds: Continuous Infusions:  sodium chloride      PRN Meds:.  Allergies as of 12/08/2023   (No Known Allergies)    Family History  Problem Relation Age of Onset   Hyperlipidemia Other    Obesity Other     Social History   Socioeconomic  History   Marital status: Married    Spouse name: Not on file   Number of children: Not on file   Years of education: Not on file   Highest education level: Not on file  Occupational History   Not on file  Tobacco Use   Smoking status: Never   Smokeless tobacco: Never  Substance and Sexual Activity   Alcohol use: No   Drug use: No   Sexual activity: Not on file  Other Topics Concern   Not on file  Social History Narrative   Not on file   Social Drivers of Health   Tobacco Use: Low Risk  (01/17/2024)   Patient History    Smoking Tobacco Use: Never    Smokeless Tobacco Use: Never    Passive Exposure: Not on file  Financial Resource Strain: Not on file  Food Insecurity: Not on file  Transportation Needs: Not on file  Physical Activity: Not on file  Stress: Not on file  Social Connections: Not on file  Intimate Partner Violence: Not on file  Depression (EYV7-0): Not on file  Alcohol Screen: Not on file  Housing: Not on file  Utilities: Not on file  Health Literacy: Not on file    Review of Systems: All negative except as stated above in HPI.  Physical Exam: Vital signs: There were no vitals filed for this visit.   General:   Morbidly obese, not in acute distress Lungs:  Clear throughout to auscultation.  Anterior exam only Heart:  Regular rate and rhythm; no murmurs, clicks, rubs,  or gallops. Abdomen: Obese abdomen, abdomen is otherwise soft, nontender, nondistended, bowel sound present, Mood and affect normal Alert and oriented x 3 Rectal:  Deferred  GI:  Lab Results: No results for input(s): WBC, HGB, HCT, PLT in the last 72 hours. BMET No results for input(s): NA, K, CL, CO2, GLUCOSE, BUN, CREATININE, CALCIUM in the last 72 hours. LFT No results for input(s): PROT, ALBUMIN, AST, ALT, ALKPHOS, BILITOT, BILIDIR, IBILI in the last 72 hours. PT/INR No results for input(s): LABPROT, INR in the last 72 hours.   Studies/Results: No results found.  Impression/Plan: -Positive Cologuard - Morbid obesity  Recommendations ---------------------------- - Proceed with colonoscopy today.  Risks (bleeding, infection, bowel perforation that could require surgery, sedation-related changes in cardiopulmonary systems), benefits (identification and possible treatment of source of symptoms, exclusion of certain causes of symptoms), and alternatives (watchful waiting, radiographic imaging studies, empiric medical treatment)   were explained to patient/family in detail and patient wishes to proceed.     LOS: 0 days   Layla Lah  MD, FACP 01/18/2024, 8:47 AM  Contact #  [phone]  "

## 2024-01-18 NOTE — Telephone Encounter (Signed)
 Called patient family member stated was at hospital not home this morning.

## 2024-01-18 NOTE — Discharge Instructions (Signed)

## 2024-01-18 NOTE — Transfer of Care (Signed)
 Immediate Anesthesia Transfer of Care Note  Patient: Robert Calhoun  Procedure(s) Performed: COLONOSCOPY POLYPECTOMY, INTESTINE  Patient Location: PACU  Anesthesia Type:MAC  Level of Consciousness: drowsy and patient cooperative  Airway & Oxygen Therapy: Patient Spontanous Breathing and Patient connected to face mask oxygen  Post-op Assessment: Report given to RN and Post -op Vital signs reviewed and stable  Post vital signs: Reviewed and stable  Last Vitals:  Vitals Value Taken Time  BP 143/62 01/18/24 10:16  Temp    Pulse 95 01/18/24 10:18  Resp 19 01/18/24 10:17  SpO2 92 % 01/18/24 10:18  Vitals shown include unfiled device data.  Last Pain:  Vitals:   01/18/24 0853  TempSrc: Oral  PainSc: 0-No pain      Patients Stated Pain Goal: 0 (01/18/24 0853)  Complications: No notable events documented.

## 2024-01-18 NOTE — Telephone Encounter (Signed)
Prescription faxed to [phone].

## 2024-01-19 LAB — SURGICAL PATHOLOGY

## 2024-01-20 ENCOUNTER — Encounter (HOSPITAL_COMMUNITY): Payer: Self-pay | Admitting: Gastroenterology

## 2024-02-10 ENCOUNTER — Encounter (HOSPITAL_COMMUNITY): Payer: Self-pay
